# Patient Record
Sex: Female | Born: 1985 | Race: White | Hispanic: No | Marital: Single | Smoking: Former smoker
Health system: Southern US, Community
[De-identification: ages and names within clinical notes are randomized; demographics above are authoritative.]

## PROBLEM LIST (undated history)

## (undated) DIAGNOSIS — E669 Obesity, unspecified: Secondary | ICD-10-CM

## (undated) DIAGNOSIS — N979 Female infertility, unspecified: Secondary | ICD-10-CM

## (undated) DIAGNOSIS — K219 Gastro-esophageal reflux disease without esophagitis: Secondary | ICD-10-CM

## (undated) HISTORY — DX: Gastro-esophageal reflux disease without esophagitis: K21.9

## (undated) HISTORY — DX: Morbid (severe) obesity due to excess calories: E66.01

## (undated) HISTORY — DX: Female infertility, unspecified: N97.9

## (undated) HISTORY — DX: Obesity, unspecified: E66.9

---
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
## 2003-04-16 ENCOUNTER — Other Ambulatory Visit: Admission: RE | Admit: 2003-04-16 | Discharge: 2003-04-16 | Payer: Self-pay | Admitting: Obstetrics and Gynecology

## 2004-05-26 ENCOUNTER — Other Ambulatory Visit: Admission: RE | Admit: 2004-05-26 | Discharge: 2004-05-26 | Payer: Self-pay | Admitting: Obstetrics and Gynecology

## 2006-03-08 ENCOUNTER — Encounter: Admission: RE | Admit: 2006-03-08 | Discharge: 2006-03-08 | Payer: Self-pay | Admitting: Gastroenterology

## 2008-12-22 ENCOUNTER — Emergency Department (HOSPITAL_COMMUNITY): Admission: EM | Admit: 2008-12-22 | Discharge: 2008-12-22 | Payer: Self-pay | Admitting: Family Medicine

## 2009-04-11 ENCOUNTER — Emergency Department (HOSPITAL_COMMUNITY): Admission: EM | Admit: 2009-04-11 | Discharge: 2009-04-11 | Payer: Self-pay | Admitting: Family Medicine

## 2009-09-01 ENCOUNTER — Emergency Department (HOSPITAL_COMMUNITY): Admission: EM | Admit: 2009-09-01 | Discharge: 2009-09-01 | Payer: Self-pay | Admitting: Family Medicine

## 2009-09-07 ENCOUNTER — Emergency Department (HOSPITAL_COMMUNITY): Admission: EM | Admit: 2009-09-07 | Discharge: 2009-09-07 | Payer: Self-pay | Admitting: Emergency Medicine

## 2010-01-01 ENCOUNTER — Emergency Department (HOSPITAL_BASED_OUTPATIENT_CLINIC_OR_DEPARTMENT_OTHER): Admission: EM | Admit: 2010-01-01 | Discharge: 2010-01-01 | Payer: Self-pay | Admitting: Emergency Medicine

## 2010-01-01 ENCOUNTER — Ambulatory Visit: Payer: Self-pay | Admitting: Interventional Radiology

## 2010-06-27 LAB — GC/CHLAMYDIA PROBE AMP, GENITAL
Chlamydia, DNA Probe: NEGATIVE
GC Probe Amp, Genital: NEGATIVE

## 2010-06-27 LAB — POCT URINALYSIS DIP (DEVICE)
Nitrite: POSITIVE — AB
Protein, ur: 30 mg/dL — AB
Specific Gravity, Urine: >=1.03 (ref 1.005–1.030)
Urobilinogen, UA: 1 mg/dL (ref 0.0–1.0)
Urobilinogen, UA: 1 mg/dL (ref 0.0–1.0)
pH: 5.5 (ref 5.0–8.0)

## 2010-06-27 LAB — HERPES SIMPLEX VIRUS CULTURE

## 2010-06-27 LAB — WET PREP, GENITAL: Yeast Wet Prep HPF POC: NONE SEEN

## 2010-06-27 LAB — URINE CULTURE

## 2010-08-09 ENCOUNTER — Other Ambulatory Visit (HOSPITAL_COMMUNITY): Payer: Self-pay | Admitting: Obstetrics and Gynecology

## 2010-08-09 DIAGNOSIS — N971 Female infertility of tubal origin: Secondary | ICD-10-CM

## 2010-08-12 ENCOUNTER — Ambulatory Visit (HOSPITAL_COMMUNITY)
Admission: RE | Admit: 2010-08-12 | Discharge: 2010-08-12 | Disposition: A | Discharge status: Home / Self Care | Payer: BC Managed Care – PPO | Source: Ambulatory Visit | Attending: Obstetrics and Gynecology | Admitting: Obstetrics and Gynecology

## 2010-08-12 DIAGNOSIS — N971 Female infertility of tubal origin: Secondary | ICD-10-CM

## 2010-08-12 DIAGNOSIS — N979 Female infertility, unspecified: Secondary | ICD-10-CM | POA: Insufficient documentation

## 2014-08-09 ENCOUNTER — Encounter (HOSPITAL_COMMUNITY): Payer: Self-pay | Admitting: *Deleted

## 2014-08-09 ENCOUNTER — Emergency Department (HOSPITAL_COMMUNITY)
Admission: EM | Admit: 2014-08-09 | Discharge: 2014-08-09 | Disposition: A | Discharge status: Home / Self Care | Payer: BC Managed Care – PPO | Attending: Emergency Medicine | Admitting: Emergency Medicine

## 2014-08-09 ENCOUNTER — Emergency Department (HOSPITAL_COMMUNITY): Payer: BC Managed Care – PPO

## 2014-08-09 DIAGNOSIS — R112 Nausea with vomiting, unspecified: Secondary | ICD-10-CM | POA: Diagnosis not present

## 2014-08-09 DIAGNOSIS — Z87891 Personal history of nicotine dependence: Secondary | ICD-10-CM | POA: Diagnosis not present

## 2014-08-09 DIAGNOSIS — Z3202 Encounter for pregnancy test, result negative: Secondary | ICD-10-CM | POA: Diagnosis not present

## 2014-08-09 DIAGNOSIS — R05 Cough: Secondary | ICD-10-CM | POA: Diagnosis not present

## 2014-08-09 DIAGNOSIS — R51 Headache: Secondary | ICD-10-CM | POA: Insufficient documentation

## 2014-08-09 DIAGNOSIS — R519 Headache, unspecified: Secondary | ICD-10-CM

## 2014-08-09 LAB — POC URINE PREG, ED: Preg Test, Ur: NEGATIVE

## 2014-08-09 MED ORDER — DIPHENHYDRAMINE HCL 50 MG/ML IJ SOLN
25.0000 mg | Freq: Once | INTRAMUSCULAR | Status: AC
  Administered 2014-08-09: 25 mg via INTRAVENOUS
  Filled 2014-08-09: qty 1

## 2014-08-09 MED ORDER — KETOROLAC TROMETHAMINE 30 MG/ML IJ SOLN
30.0000 mg | Freq: Once | INTRAMUSCULAR | Status: AC
  Administered 2014-08-09: 30 mg via INTRAVENOUS
  Filled 2014-08-09: qty 1

## 2014-08-09 MED ORDER — SODIUM CHLORIDE 0.9 % IV BOLUS (SEPSIS)
500.0000 mL | Freq: Once | INTRAVENOUS | Status: AC
  Administered 2014-08-09: 500 mL via INTRAVENOUS

## 2014-08-09 MED ORDER — FENTANYL CITRATE (PF) 100 MCG/2ML IJ SOLN
50.0000 ug | Freq: Once | INTRAMUSCULAR | Status: AC
  Administered 2014-08-09: 50 ug via INTRAVENOUS
  Filled 2014-08-09: qty 2

## 2014-08-09 MED ORDER — PROMETHAZINE HCL 25 MG/ML IJ SOLN
25.0000 mg | Freq: Once | INTRAMUSCULAR | Status: AC
  Administered 2014-08-09: 25 mg via INTRAVENOUS
  Filled 2014-08-09: qty 1

## 2014-08-09 NOTE — Discharge Instructions (Signed)

## 2014-08-09 NOTE — ED Provider Notes (Signed)
CSN: [id number]     Arrival date & time 08/09/14  1045 History   First MD Initiated Contact with Patient 08/09/14 1048     Chief Complaint  Patient presents with  . Migraine     (Consider location/radiation/quality/duration/timing/severity/associated sxs/prior Treatment) Patient is a 29 y.o. female presenting with migraines. The history is provided by the patient.  Migraine Associated symptoms include headaches. Pertinent negatives include no chest pain, no abdominal pain and no shortness of breath.   patient has had a headache since yesterday. It is throbbing and on the back of her head. His been getting more severe. No fevers or chills. She does have some photophobia and nausea. States she had headaches when she was younger that were migraines and this is a lot like that but it never lasted this long that she never had vomiting with it. No new trauma. She has had a little bit of a cough. The light bothers her a little bit. She does not think she can be pregnant because she has tried IVF and not gotten pregnant. No diarrhea. Sick contacts.  History reviewed. No pertinent past medical history. History reviewed. No pertinent past surgical history. History reviewed. No pertinent family history. History  Substance Use Topics  . Smoking status: Former Games developermoker  . Smokeless tobacco: Not on file  . Alcohol Use: Yes     Comment: ocassional   OB History    No data available     Review of Systems  Constitutional: Negative for activity change and appetite change.  Eyes: Positive for photophobia. Negative for pain.  Respiratory: Positive for cough. Negative for chest tightness and shortness of breath.   Cardiovascular: Negative for chest pain and leg swelling.  Gastrointestinal: Positive for nausea and vomiting. Negative for abdominal pain and diarrhea.  Genitourinary: Negative for flank pain.  Musculoskeletal: Negative for back pain, neck pain and neck stiffness.  Skin: Negative for rash.   Neurological: Positive for headaches. Negative for weakness and numbness.  Psychiatric/Behavioral: Negative for behavioral problems.      Allergies  Review of patient's allergies indicates not on file.  Home Medications   Prior to Admission medications   Not on File   BP 116/69 mmHg  Pulse 95  Temp(Src) 99.6 F (37.6 C) (Rectal)  Resp 14  Ht 5\' 8"  (1.727 m)  Wt 300 lb (136.079 kg)  BMI 45.63 kg/m2  SpO2 97%  LMP 06/28/2014 Physical Exam  Constitutional: She appears well-developed.  Patient is obese  HENT:  Head: Atraumatic.  Neck: Neck supple.  Cardiovascular: Regular rhythm.   Tachycardia  Pulmonary/Chest: Effort normal.  Abdominal: Soft.  Musculoskeletal: Normal range of motion.  Neurological: She is alert.  Skin: Skin is warm.  Nursing note and vitals reviewed.   ED Course  Procedures (including critical care time) Labs Review Labs Reviewed  POC URINE PREG, ED    Imaging Review Ct Head Wo Contrast  08/09/2014   CLINICAL DATA:  Small headache yesterday. Now worse headache she has ever had. Vomiting.  EXAM: CT HEAD WITHOUT CONTRAST  TECHNIQUE: Contiguous axial images were obtained from the base of the skull through the vertex without intravenous contrast.  COMPARISON:  None.  FINDINGS: There is no intra or extra-axial fluid collection or mass lesion. The basilar cisterns and ventricles have a normal appearance. There is no CT evidence for acute infarction or hemorrhage. Bone windows are unremarkable.  IMPRESSION: Negative exam.   Electronically Signed   By: Norva PavlovElizabeth  Brown M.D.   On:  08/09/2014 12:18     EKG Interpretation None      MDM   Final diagnoses:  Acute nonintractable headache, unspecified headache type    Patient with headache. Previous history of migraines but not recently. No fever. Feels better after treatment and will be discharged home. Will give information for neurology follow-up.    Benjiman Core, MD 08/09/14 1351

## 2014-08-09 NOTE — ED Notes (Signed)
Pt is in stable condition upon d/c and ambulates from ED. 

## 2014-08-09 NOTE — ED Notes (Signed)
Pt arrives from home via POV. Pt states she woke up yesterday with a small h/a. Pt states she took ibuprofen, percocet and tried a netty pot with no relief. Pt states her h/a has continued to intensify and has also experienced some blurred vision, light sensitivity and has had 2 episodes of vomiting.

## 2014-08-11 ENCOUNTER — Encounter: Payer: Self-pay | Admitting: Neurology

## 2014-08-11 ENCOUNTER — Ambulatory Visit (INDEPENDENT_AMBULATORY_CARE_PROVIDER_SITE_OTHER): Payer: BC Managed Care – PPO | Admitting: Neurology

## 2014-08-11 VITALS — BP 150/84 | HR 86 | Resp 20 | Ht 68.9 in | Wt 307.0 lb

## 2014-08-11 DIAGNOSIS — G43511 Persistent migraine aura without cerebral infarction, intractable, with status migrainosus: Secondary | ICD-10-CM

## 2014-08-11 HISTORY — DX: Morbid (severe) obesity due to excess calories: E66.01

## 2014-08-11 MED ORDER — TOPIRAMATE 25 MG PO TABS: 25.0000 mg | ORAL_TABLET | Freq: Two times a day (BID) | ORAL | Status: DC

## 2014-08-11 MED ORDER — SUMATRIPTAN 5 MG/ACT NA SOLN: 1.0000 | NASAL | Status: DC | PRN

## 2014-08-11 MED ORDER — PROMETHAZINE HCL 12.5 MG PO TABS: 12.5000 mg | ORAL_TABLET | Freq: Four times a day (QID) | ORAL | Status: DC | PRN

## 2014-08-11 NOTE — Patient Instructions (Signed)
Topiramate tablets What is this medicine? TOPIRAMATE (toe PYRE a mate) is used to treat seizures in adults or children with epilepsy. It is also used for the prevention of migraine headaches. This medicine may be used for other purposes; ask your health care provider or pharmacist if you have questions. COMMON BRAND NAME(S): Topamax, Topiragen What should I tell my health care provider before I take this medicine? They need to know if you have any of these conditions: -bleeding disorders -cirrhosis of the liver or liver disease -diarrhea -glaucoma -kidney stones or kidney disease -low blood counts, like low white cell, platelet, or red cell counts -lung disease like asthma, obstructive pulmonary disease, emphysema -metabolic acidosis -on a ketogenic diet -schedule for surgery or a procedure -suicidal thoughts, plans, or attempt; a previous suicide attempt by you or a family member -an unusual or allergic reaction to topiramate, other medicines, foods, dyes, or preservatives -pregnant or trying to get pregnant -breast-feeding How should I use this medicine? Take this medicine by mouth with a glass of water. Follow the directions on the prescription label. Do not crush or chew. You may take this medicine with meals. Take your medicine at regular intervals. Do not take it more often than directed. Talk to your pediatrician regarding the use of this medicine in children. Special care may be needed. While this drug may be prescribed for children as young as 86 years of age for selected conditions, precautions do apply. Overdosage: If you think you have taken too much of this medicine contact a poison control center or emergency room at once. NOTE: This medicine is only for you. Do not share this medicine with others. What if I miss a dose? If you miss a dose, take it as soon as you can. If your next dose is to be taken in less than 6 hours, then do not take the missed dose. Take the next dose at  your regular time. Do not take double or extra doses. What may interact with this medicine? Do not take this medicine with any of the following medications: -probenecid This medicine may also interact with the following medications: -acetazolamide -alcohol -amitriptyline -aspirin and aspirin-like medicines -birth control pills -certain medicines for depression -certain medicines for seizures -certain medicines that treat or prevent blood clots like warfarin, enoxaparin, dalteparin, apixaban, dabigatran, and rivaroxaban -digoxin -hydrochlorothiazide -lithium -medicines for pain, sleep, or muscle relaxation -metformin -methazolamide -NSAIDS, medicines for pain and inflammation, like ibuprofen or naproxen -pioglitazone -risperidone This list may not describe all possible interactions. Give your health care provider a list of all the medicines, herbs, non-prescription drugs, or dietary supplements you use. Also tell them if you smoke, drink alcohol, or use illegal drugs. Some items may interact with your medicine. What should I watch for while using this medicine? Visit your doctor or health care professional for regular checks on your progress. Do not stop taking this medicine suddenly. This increases the risk of seizures if you are using this medicine to control epilepsy. Wear a medical identification bracelet or chain to say you have epilepsy or seizures, and carry a card that lists all your medicines. This medicine can decrease sweating and increase your body temperature. Watch for signs of deceased sweating or fever, especially in children. Avoid extreme heat, hot baths, and saunas. Be careful about exercising, especially in hot weather. Contact your health care provider right away if you notice a fever or decrease in sweating. You should drink plenty of fluids while taking this medicine.  If you have had kidney stones in the past, this will help to reduce your chances of forming kidney  stones. If you have stomach pain, with nausea or vomiting and yellowing of your eyes or skin, call your doctor immediately. You may get drowsy, dizzy, or have blurred vision. Do not drive, use machinery, or do anything that needs mental alertness until you know how this medicine affects you. To reduce dizziness, do not sit or stand up quickly, especially if you are an older patient. Alcohol can increase drowsiness and dizziness. Avoid alcoholic drinks. If you notice blurred vision, eye pain, or other eye problems, seek medical attention at once for an eye exam. The use of this medicine may increase the chance of suicidal thoughts or actions. Pay special attention to how you are responding while on this medicine. Any worsening of mood, or thoughts of suicide or dying should be reported to your health care professional right away. This medicine may increase the chance of developing metabolic acidosis. If left untreated, this can cause kidney stones, bone disease, or slowed growth in children. Symptoms include breathing fast, fatigue, loss of appetite, irregular heartbeat, or loss of consciousness. Call your doctor immediately if you experience any of these side effects. Also, tell your doctor about any surgery you plan on having while taking this medicine since this may increase your risk for metabolic acidosis. Birth control pills may not work properly while you are taking this medicine. Talk to your doctor about using an extra method of birth control. Women who become pregnant while using this medicine may enroll in the [place] [employer] Antiepileptic Drug Pregnancy Registry by calling [phone]. This registry collects information about the safety of antiepileptic drug use during pregnancy. What side effects may I notice from receiving this medicine? Side effects that you should report to your doctor or health care professional as soon as possible: -allergic reactions like skin rash, itching or hives,  swelling of the face, lips, or tongue -decreased sweating and/or rise in body temperature -depression -difficulty breathing, fast or irregular breathing patterns -difficulty speaking -difficulty walking or controlling muscle movements -hearing impairment -redness, blistering, peeling or loosening of the skin, including inside the mouth -tingling, pain or numbness in the hands or feet -unusual bleeding or bruising -unusually weak or tired -worsening of mood, thoughts or actions of suicide or dying Side effects that usually do not require medical attention (report to your doctor or health care professional if they continue or are bothersome): -altered taste -back pain, joint or muscle aches and pains -diarrhea, or constipation -headache -loss of appetite -nausea -stomach upset, indigestion -tremors This list may not describe all possible side effects. Call your doctor for medical advice about side effects. You may report side effects to FDA at 1-800-FDA-1088. Where should I keep my medicine? Keep out of the reach of children. Store at room temperature between 15 and 30 degrees C (59 and 86 degrees F) in a tightly closed container. Protect from moisture. Throw away any unused medicine after the expiration date. NOTE: This sheet is a summary. It may not cover all possible information. If you have questions about this medicine, talk to your doctor, pharmacist, or health care provider.  2015, [employer]. (2013-03-31 23:17:57) Promethazine tablets What is this medicine? PROMETHAZINE (proe METH a zeen) is an antihistamine. It is used to treat allergic reactions and to treat or prevent nausea and vomiting from illness or motion sickness. It is also used to make you sleep before surgery, and  to help treat pain or nausea after surgery. This medicine may be used for other purposes; ask your health care provider or pharmacist if you have questions. COMMON BRAND NAME(S): Phenergan What  should I tell my health care provider before I take this medicine? They need to know if you have any of these conditions: -glaucoma -high blood pressure or heart disease -kidney disease -liver disease -lung or breathing disease, like asthma -prostate trouble -pain or difficulty passing urine -seizures -an unusual or allergic reaction to promethazine or phenothiazines, other medicines, foods, dyes, or preservatives -pregnant or trying to get pregnant -breast-feeding How should I use this medicine? Take this medicine by mouth with a glass of water. Follow the directions on the prescription label. Take your doses at regular intervals. Do not take your medicine more often than directed. Talk to your pediatrician regarding the use of this medicine in children. Special care may be needed. This medicine should not be given to infants and children younger than 40 years old. Overdosage: If you think you have taken too much of this medicine contact a poison control center or emergency room at once. NOTE: This medicine is only for you. Do not share this medicine with others. What if I miss a dose? If you miss a dose, take it as soon as you can. If it is almost time for your next dose, take only that dose. Do not take double or extra doses. What may interact with this medicine? Do not take this medicine with any of the following medications: -cisapride -dofetilide -dronedarone -MAOIs like Carbex, Eldepryl, Marplan, Nardil, Parnate -pimozide -quinidine, including dextromethorphan; quinidine -thioridazine -ziprasidone This medicine may also interact with the following medications: -certain medicines for depression, anxiety, or psychotic disturbances -certain medicines for anxiety or sleep -certain medicines for seizures like carbamazepine, phenobarbital, phenytoin -certain medicines for movement abnormalities as in Parkinson's disease, or for gastrointestinal problems -epinephrine -medicines for  allergies or colds -muscle relaxants -narcotic medicines for pain -other medicines that prolong the QT interval (cause an abnormal heart rhythm) -tramadol -trimethobenzamide This list may not describe all possible interactions. Give your health care provider a list of all the medicines, herbs, non-prescription drugs, or dietary supplements you use. Also tell them if you smoke, drink alcohol, or use illegal drugs. Some items may interact with your medicine. What should I watch for while using this medicine? Tell your doctor or health care professional if your symptoms do not start to get better in 1 to 2 days. You may get drowsy or dizzy. Do not drive, use machinery, or do anything that needs mental alertness until you know how this medicine affects you. To reduce the risk of dizzy or fainting spells, do not stand or sit up quickly, especially if you are an older patient. Alcohol may increase dizziness and drowsiness. Avoid alcoholic drinks. Your mouth may get dry. Chewing sugarless gum or sucking hard candy, and drinking plenty of water may help. Contact your doctor if the problem does not go away or is severe. This medicine may cause dry eyes and blurred vision. If you wear contact lenses you may feel some discomfort. Lubricating drops may help. See your eye doctor if the problem does not go away or is severe. This medicine can make you more sensitive to the sun. Keep out of the sun. If you cannot avoid being in the sun, wear protective clothing and use sunscreen. Do not use sun lamps or tanning beds/booths. If you are diabetic, check your blood-sugar levels  regularly. What side effects may I notice from receiving this medicine? Side effects that you should report to your doctor or health care professional as soon as possible: -blurred vision -irregular heartbeat, palpitations or chest pain -muscle or facial twitches -pain or difficulty passing urine -seizures -skin rash -slowed or shallow  breathing -unusual bleeding or bruising -yellowing of the eyes or skin Side effects that usually do not require medical attention (report to your doctor or health care professional if they continue or are bothersome): -headache -nightmares, agitation, nervousness, excitability, not able to sleep (these are more likely in children) -stuffy nose This list may not describe all possible side effects. Call your doctor for medical advice about side effects. You may report side effects to FDA at 1-800-FDA-1088. Where should I keep my medicine? Keep out of the reach of children. Store at room temperature, between 20 and 25 degrees C (68 and 77 degrees F). Protect from light. Throw away any unused medicine after the expiration date. NOTE: This sheet is a summary. It may not cover all possible information. If you have questions about this medicine, talk to your doctor, pharmacist, or health care provider.  2015, [employer]. (2012-11-26 15:04:46)

## 2014-08-11 NOTE — Progress Notes (Signed)
[facility]   Provider:  Melvyn Novas, M D  Referring Provider: Primary Care Physician:  Bosie Clos, MD  Chief Complaint  Patient presents with  . Migraine    new patient, rm 10, alone    HPI:  Jeree B Reicher is a 29 y.o. female  Is seen here as a referral from Emergency Room, Dr Benjiman Core  Mrs. Piedad Climes reports that her had begun hurting on Saturday morning and there headaches exacerbated further. She tried home remedies she tried to sleep it off she used ice packs she used hot showers etc. nothing seemed to have attached a headache by Sunday she was desperate enough to go to the emergency room. There a CT of the head was obtained which was REM unremarkable. There was a bunch of blood draws as well. A pregnancy test was negative. Mrs. Piedad Climes has actually tried to conceive and had in vitro fertilization but this has been unsuccessful thus far. On Saturday she had tried some ibuprofen, to no avail by 2 in the morning on Sunday morning she felt so ill she had to vomit. She vomited twice and then she called her parents to drive her to the emergency room as she did not feel capable of doing so. Another observation is that on Saturday she could not read the labels of the spice rack in her kitchen her vision was too blurred. She had also had her hair done on Saturday she had her hair straightened but this usually does not cause her to have headaches or discomfort. Localized over the occiput and to the nape of the neck she felt a throbbing headache and her neck area felt very tight and tender. Massaging the occipital area did not help her. The patient received medication in the emergency room she was given Benadryl, fentanyl injection Toradol and Phenergan she was also given a 500 mL fluid bolus of normal saline. She fell asleep and the ED physician discharged her. She slept at home still for several hours, She did nor return to work.  She woke up on Monday morning feeling  somewhat restored but today she has again retro-orbital pressure and she is fearful that she will trigger another headache attack she is also concerned that she may not be able to drive again. She also reported that she had extreme photophobia, bright lights may take her headaches worse, and today in the room I had to dim the lights a little bit.  Over the last 18 months however she has not been on fertility drugs, and is on no medications . She has always been obese. She also is not on nutritional supplements such as multivitamins etc. She has not ever had a headache of the intensity and duration that she suffered last weekend. In her review of systems she endorsed blurred vision fevers chills coughing feeling hot and cold having an increased level of thirst dizziness headaches insomnia and change in appetite. He has a past medical history for infertility and obesity. She is a former smoker was not actively smoking. Her family medical conditions are positive for father with heart disease pituitary tumor there have been a mother and maternal relatives such as grandmother and aunts with thyroid problems and pulse grandmothers paternal and maternal were diabetic.   The patient has no history of any major surgeries traumatic brain injuries or other trauma. She was not infected at the time of the headache also   Review of Systems: Out of a complete 14 system  review, the patient complains of only the following symptoms, and all other reviewed systems are negative. headache  Fever and chills.  Eye pressure, neck tightness.    History   Social History  . Marital Status: Married    Spouse Name: N/A  . Number of Children: N/A  . Years of Education: N/A   Occupational History  . teacher    Social History Main Topics  . Smoking status: Former Smoker    Quit date: 04/10/2010  . Smokeless tobacco: Not on file  . Alcohol Use: 0.0 oz/week    0 Standard drinks or equivalent per week     Comment:  occasional  . Drug Use: No  . Sexual Activity: Yes   Other Topics Concern  . Not on file   Social History Narrative   Drinks 4-5 cups daily.    Family History  Problem Relation Age of Onset  . Thyroid disease Mother     Past Medical History  Diagnosis Date  . Obesity   . Female fertility problems   . GERD (gastroesophageal reflux disease)     History reviewed. No pertinent past surgical history.  No current outpatient prescriptions on file.   No current facility-administered medications for this visit.    Allergies as of 08/11/2014  . (Not on File)    Vitals: BP 150/84 mmHg  Pulse 86  Resp 20  Ht 5' 8.9" (1.75 m)  Wt 307 lb (139.254 kg)  BMI 45.47 kg/m2  LMP 06/28/2014 Last Weight:  Wt Readings from Last 1 Encounters:  08/11/14 307 lb (139.254 kg)       Last Height:   Ht Readings from Last 1 Encounters:  08/11/14 5' 8.9" (1.75 m)    Physical exam:  General: The patient is awake, alert and appears not in acute distress. The patient is well groomed. Head: Normocephalic, atraumatic.  Neck is supple. Mallampati , 3  neck circumference: 17. Nasal airflow restricted , TMJ is not  evident . Retrognathia is seen.  Cardiovascular:  Regular rate and rhythm , without  murmurs or carotid bruit, and without distended neck veins. Respiratory: Lungs are clear to auscultation. Skin:  Without evidence of edema, or rash Trunk: BMI is severely  elevated and patient  has normal posture.  Neurologic exam : The patient is awake and alert, oriented to place and time.   Memory subjective  described as intact. There is a normal attention span & concentration ability.  Speech is fluent without dysarthria, dysphonia or aphasia. Mood and affect are appropriate.  Cranial nerves: Pupils are equal and briskly reactive to light. Funduscopic exam without  evidence of  edema. Extraocular movements  in vertical and horizontal planes intact and without nystagmus. Visual fields by  finger perimetry are intact. Hearing to finger rub intact.   The patient is photosensitive -Facial sensation intact to fine touch.  Facial motor strength is symmetric and tongue and uvula move midline.  Motor exam:   Normal tone ,muscle bulk and symmetric ,strength in all extremities.  Sensory:  Fine touch, pinprick and vibration were tested in all extremities. Proprioception is  normal.  Coordination: Rapid alternating movements in the fingers/hands is normal. Finger-to-nose maneuver  normal without evidence of ataxia, dysmetria or tremor.  Gait and station: Patient walks without assistive device and is able unassisted to climb up to the exam table.  Strength within normal limits. Stance is stable and normal. Tandem gait is unfragmented. Romberg testing is negative.  Deep tendon reflexes: in the  upper and lower extremities are symmetric and intact.  Babinski maneuver response is  downgoing.   Assessment:  After physical and neurologic examination, review of laboratory studies, imaging, neurophysiology testing and pre-existing records, assessment is   Patient describes a pulsating headache associated with photophobia severe nausea and vomiting. This would qualify as migraines. Given her risk factors aren't elevated BMI she could also have an elevated intracranial pressure. Her CT did not show a mass.  He was younger a teenager actually she had sometimes headaches that would wake her from sleep but she has no sleep related headaches at this time headaches normally have started somewhere during the day and got progressively worse sleep or resting was actually a way to escape the headaches. Since the migraine cocktail in the emergency room has worked our also provide her with some oral medication that she can take at home but one of my goals for the patient is to start her on topiramate 25 mg twice a day this also may induce more urinary it usually addresses in obese patients the intercranial  pressure and reduce his. At this time I do not order a lumbar puncture as she feels she has gotten better and I will concentrate on this headache as a migraine. However should she have from here on out more of these headaches and more headaches that linger sometimes for a day or 2 I would certainly be contemplating an MRI of the brain and a lumbar puncture in a revisit.  Besides the topiramate to reduce headache trigger I will ask the patient to keep hydrating herself with water, avoiding the artificial sweeteners and concentrate on Protein and fresh greens and fruits. The patient was exquisitely nauseated with her headaches a triptan orally is not likely to help her. I will order her some Phenergan she has Benadryl at home, I would like to consider a nasal spray triptan as it may work quicker and bypasses the oral intake in a nauseated patient.   The patient was advised of the nature of the diagnosed sleep disorder , the treatment options and risks for general a health and wellness arising from not treating the condition. Visit duration was 30 minutes.   Plan:  Treatment plan and additional workup :  Start topiramate 25 mg twice a day by mouth #1  #2 Imitrex as nasal spray or any other triptan as nasal spray for that matter.  #3 Phenergan for home use can be suppository.  #4 hydration hydration hydration  #5 MRI and spinal tap postponed the that these tests will be done only if the patient doesn't have relief over the next 14 days.   Porfirio Mylar  Deadra Diggins M.D.     Porfirio Mylar Japleen Tornow MD  08/11/2014

## 2014-08-26 ENCOUNTER — Ambulatory Visit: Payer: BC Managed Care – PPO | Admitting: Neurology

## 2014-09-21 ENCOUNTER — Ambulatory Visit: Payer: BC Managed Care – PPO | Admitting: Neurology

## 2017-01-12 ENCOUNTER — Other Ambulatory Visit: Payer: Self-pay | Admitting: Family

## 2017-01-12 ENCOUNTER — Ambulatory Visit
Admission: RE | Admit: 2017-01-12 | Discharge: 2017-01-12 | Disposition: A | Discharge status: Home / Self Care | Payer: BC Managed Care – PPO | Source: Ambulatory Visit | Attending: Family | Admitting: Family

## 2017-01-12 DIAGNOSIS — G43019 Migraine without aura, intractable, without status migrainosus: Secondary | ICD-10-CM

## 2017-11-10 IMAGING — CT CT HEAD W/O CM
1 series · 16 of 30 positions shown, 20 images · non-contrast
Comparison: 08/09/2014

CLINICAL DATA: Headaches for 5 days.

EXAM:
CT HEAD WITHOUT CONTRAST
TECHNIQUE: Contiguous axial images were obtained from the base of the skull
through the vertex without intravenous contrast.

[Series 2: head w/(date) · axial · 0.44mm/px · z∈[+359,+499]mm · 16 of 32 slices shown, 20 images]
[im 2/32  brain]
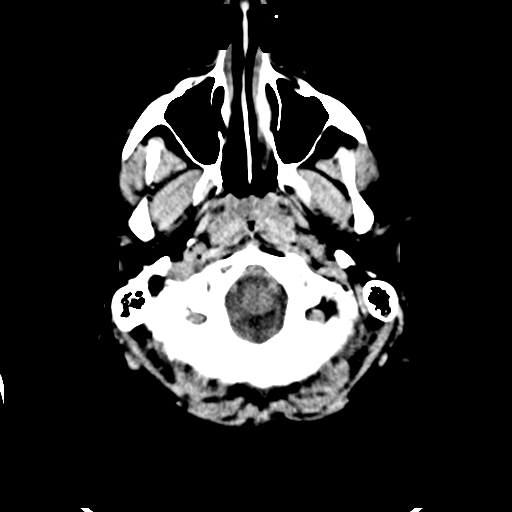
[im 2/32  bone]
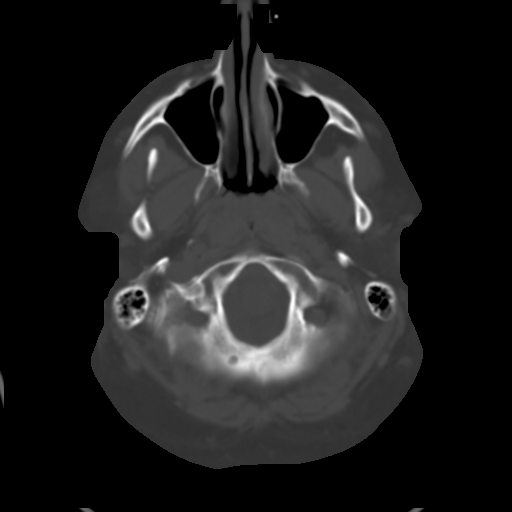
[im 4/32  brain]
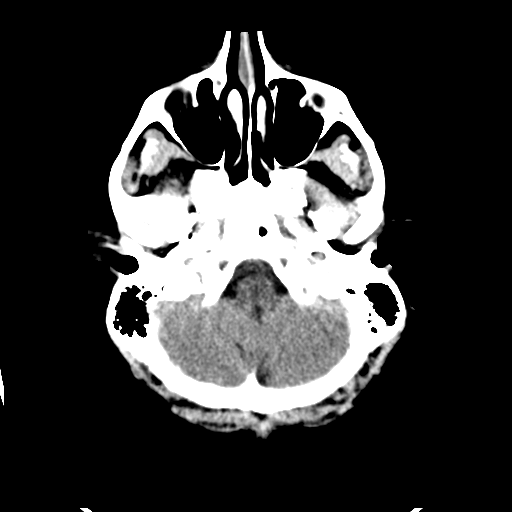
[im 6/32  brain]
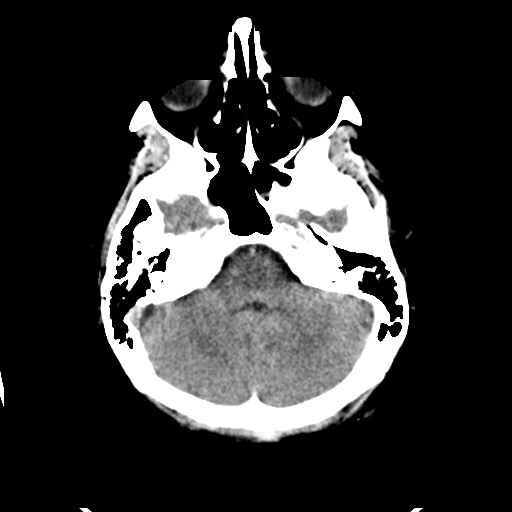
[im 8/32  brain]
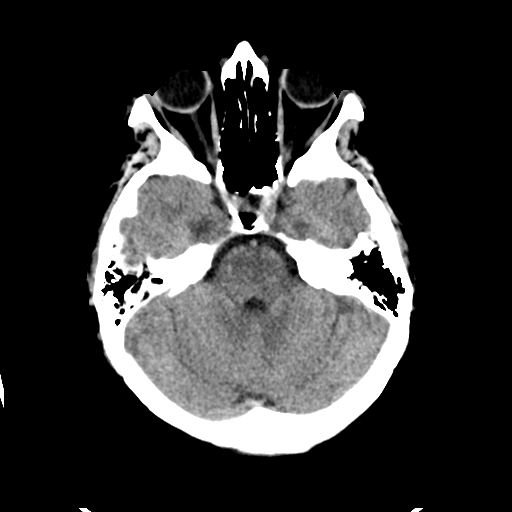
[im 9/32  brain]
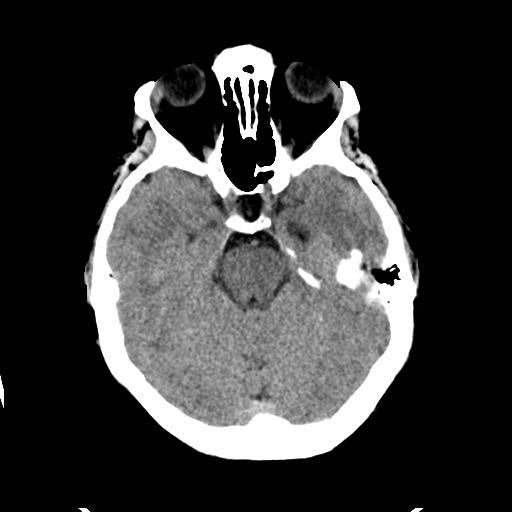
[im 9/32  bone]
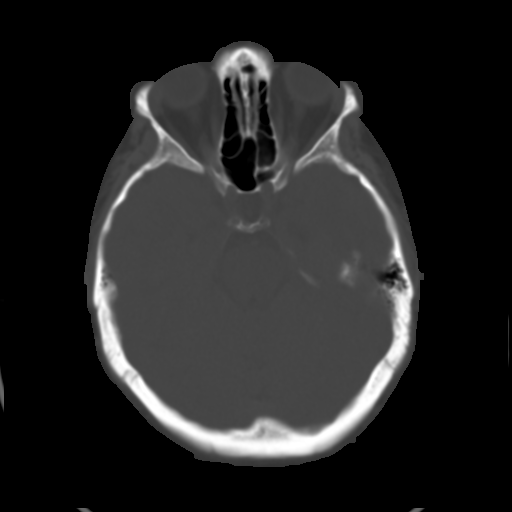
[im 11/32  brain]
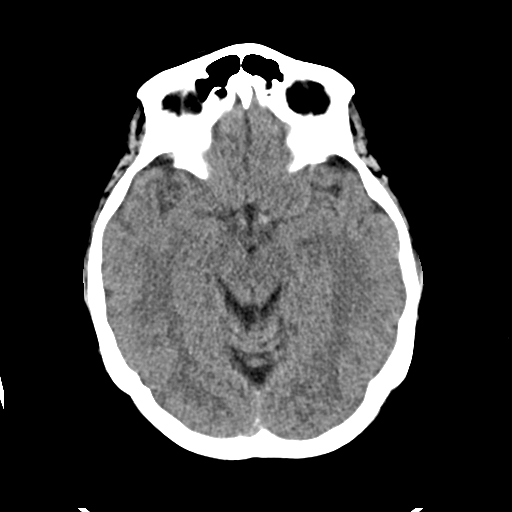
[im 13/32  brain]
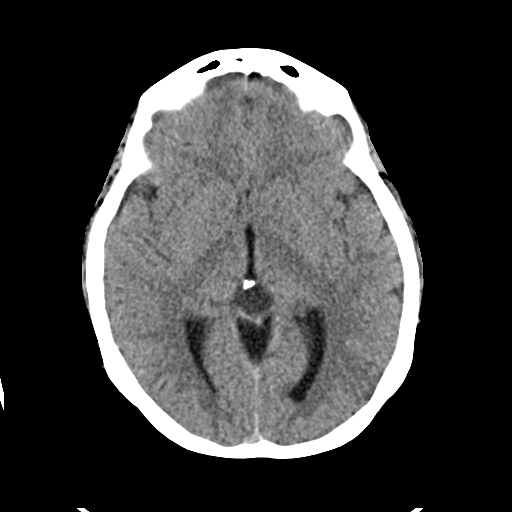
[im 15/32  brain]
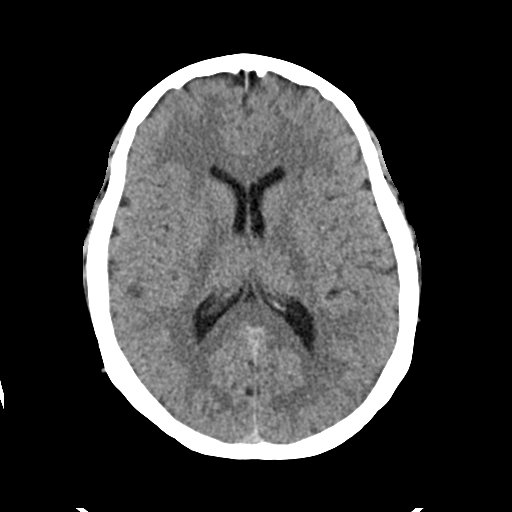
[im 17/32  brain]
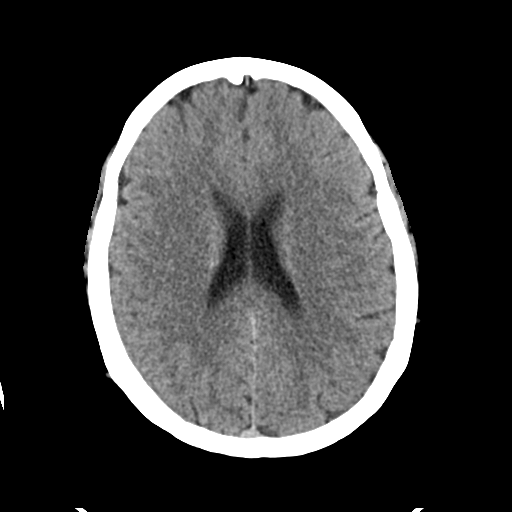
[im 17/32  bone]
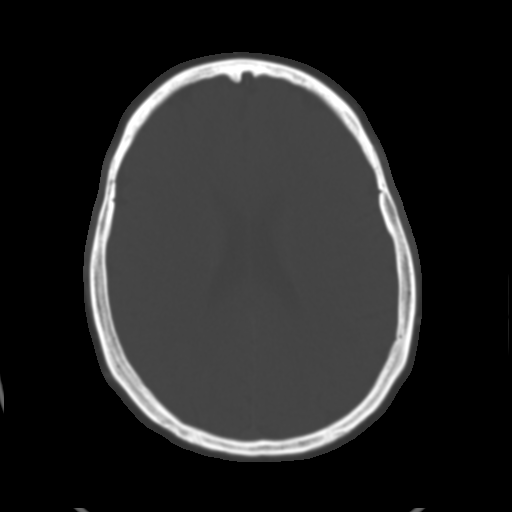
[im 19/32  brain]
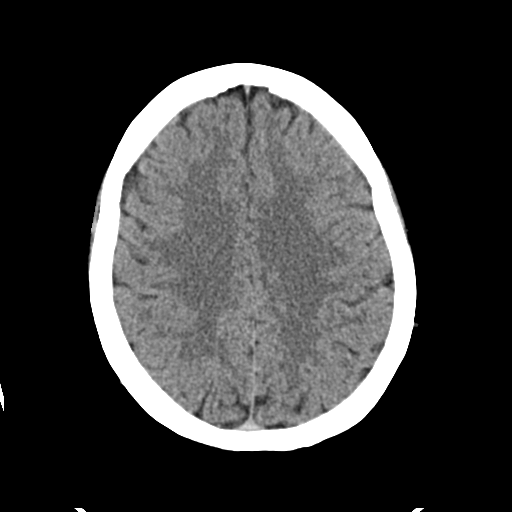
[im 21/32  brain]
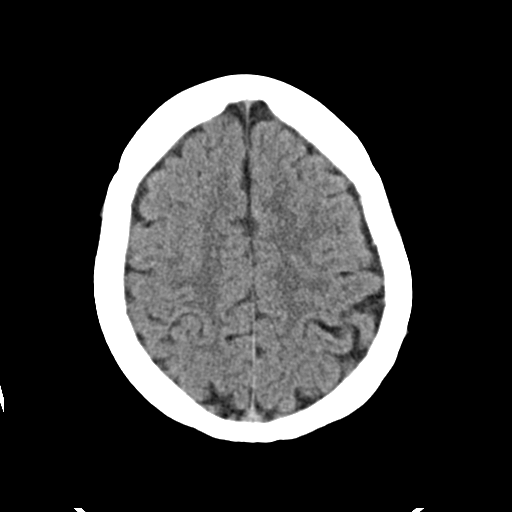
[im 23/32  brain]
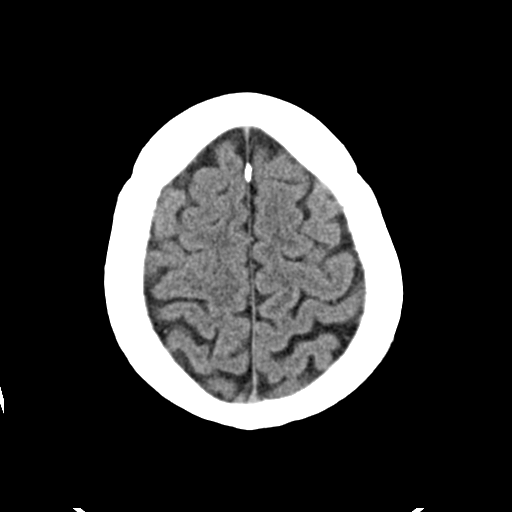
[im 24/32  brain]
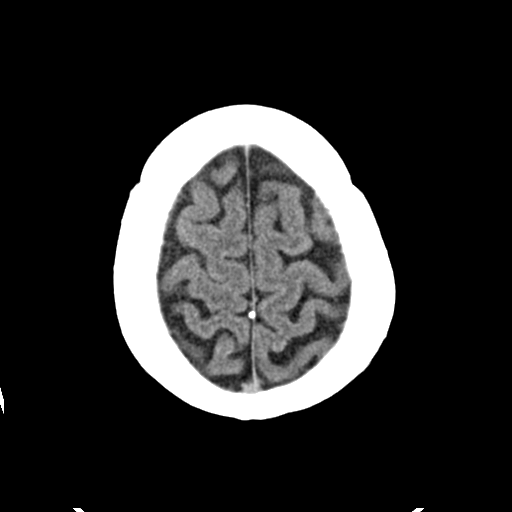
[im 24/32  bone]
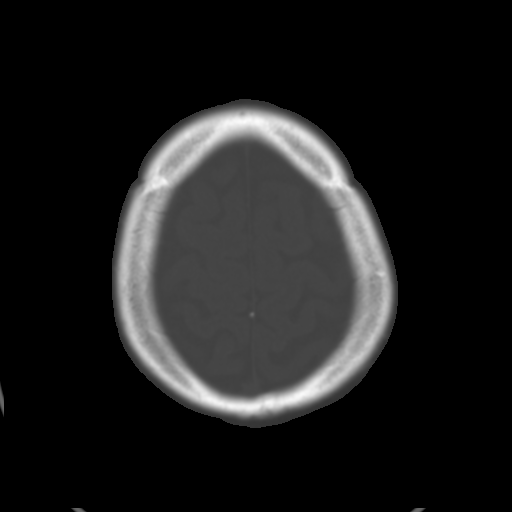
[im 26/32  brain]
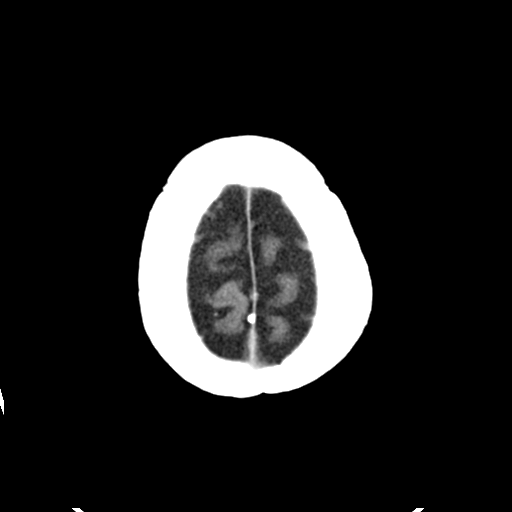
[im 28/32  brain]
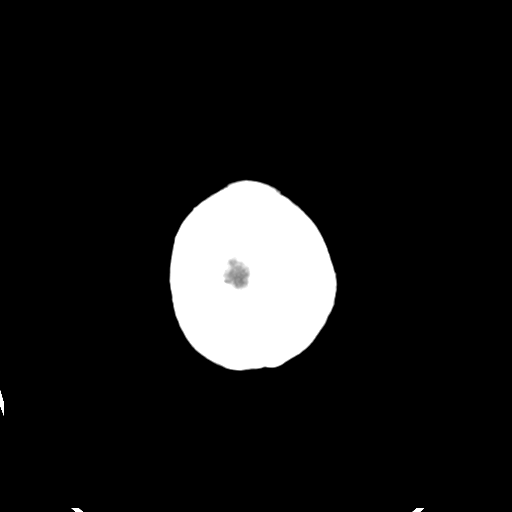
[im 30/32  brain]
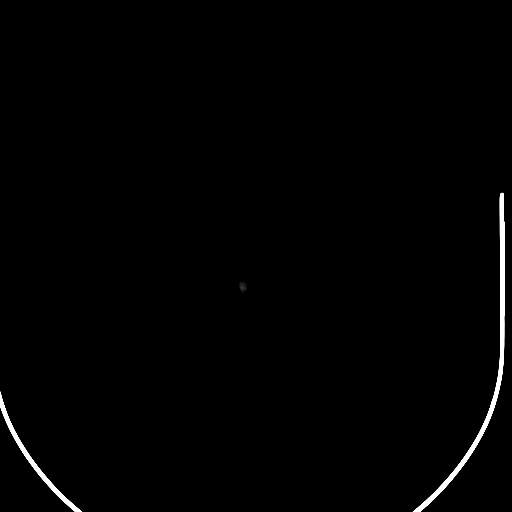

[16 of 30 positions shown; findings below may reference images not displayed]

FINDINGS: Brain: No evidence of acute infarction, hemorrhage, hydrocephalus,
extra-axial collection or mass lesion/mass effect.

Vascular: No hyperdense vessel or unexpected calcification.

Skull: Normal. Negative for fracture or focal lesion.

Sinuses/Orbits: Normal globes and orbits. Small amount of dependent
secretions in the left sphenoid sinus. Visualized sinuses otherwise
clear. Clear mastoid air cells.

Other: None.
IMPRESSION: 1. No intracranial abnormality.

## 2019-04-11 NOTE — L&D Delivery Note (Signed)
Delivery Note Once room fully set up and team prepared for delivery, arom performed and NICU called for delivery.  Cx c/c/-2; iupc placed as not palpable nor picked up with external monitoring Ctx q 2 min; pt then able to feel and then able to begin pushing; full NICU team present with pushing onset. At  a viable female was delivered over intact perineum via svd (Presentation: cephalic ; OA ).  APGAR: call to NICU, not yet provided by neonatologist , ; weight 3'5.6" (1520g)  .   Placenta status: delivered spontaneously, intact.  Cord: 3vv, with the following complications: none.  Anesthesia:  none Episiotomy:  none Lacerations:  none Suture Repair: n/a Est. Blood Loss (mL):   Mom to postpartum.  Baby to NICU.  Vick Frees 06/13/2019, 10:47 AM

## 2019-06-11 ENCOUNTER — Encounter (HOSPITAL_COMMUNITY): Payer: Self-pay | Admitting: Obstetrics and Gynecology

## 2019-06-11 ENCOUNTER — Other Ambulatory Visit: Payer: Self-pay

## 2019-06-11 ENCOUNTER — Inpatient Hospital Stay (HOSPITAL_COMMUNITY)
Admission: AD | Admit: 2019-06-11 | Discharge: 2019-06-15 | DRG: 807 | Disposition: A | Discharge status: Home / Self Care | Payer: BC Managed Care – PPO | Source: Ambulatory Visit | Attending: Obstetrics and Gynecology | Admitting: Obstetrics and Gynecology

## 2019-06-11 DIAGNOSIS — O99214 Obesity complicating childbirth: Secondary | ICD-10-CM | POA: Diagnosis present

## 2019-06-11 DIAGNOSIS — Z3A26 26 weeks gestation of pregnancy: Secondary | ICD-10-CM | POA: Diagnosis not present

## 2019-06-11 DIAGNOSIS — Z20822 Contact with and (suspected) exposure to covid-19: Secondary | ICD-10-CM | POA: Diagnosis present

## 2019-06-11 DIAGNOSIS — O99284 Endocrine, nutritional and metabolic diseases complicating childbirth: Secondary | ICD-10-CM | POA: Diagnosis present

## 2019-06-11 DIAGNOSIS — E282 Polycystic ovarian syndrome: Secondary | ICD-10-CM | POA: Diagnosis present

## 2019-06-11 LAB — COMPREHENSIVE METABOLIC PANEL
ALT: 24 U/L (ref 0–44)
AST: 19 U/L (ref 15–41)
Albumin: 2.6 g/dL — ABNORMAL LOW (ref 3.5–5.0)
Alkaline Phosphatase: 49 U/L (ref 38–126)
Anion gap: 9 (ref 5–15)
BUN: <5 mg/dL — ABNORMAL LOW (ref 6–20)
CO2: 21 mmol/L — ABNORMAL LOW (ref 22–32)
Calcium: 8.8 mg/dL — ABNORMAL LOW (ref 8.9–10.3)
Chloride: 108 mmol/L (ref 98–111)
Creatinine, Ser: 0.51 mg/dL (ref 0.44–1.00)
GFR calc Af Amer: >60 mL/min (ref >60)
GFR calc non Af Amer: >60 mL/min (ref >60)
Glucose, Bld: 104 mg/dL — ABNORMAL HIGH (ref 70–99)
Potassium: 3.6 mmol/L (ref 3.5–5.1)
Sodium: 138 mmol/L (ref 135–145)
Total Bilirubin: 0.6 mg/dL (ref 0.3–1.2)
Total Protein: 6.4 g/dL — ABNORMAL LOW (ref 6.5–8.1)

## 2019-06-11 LAB — SARS CORONAVIRUS 2 (TAT 6-24 HRS): SARS Coronavirus 2: NEGATIVE

## 2019-06-11 LAB — CBC
HCT: 36.1 % (ref 36.0–46.0)
Hemoglobin: 11.6 g/dL — ABNORMAL LOW (ref 12.0–15.0)
MCH: 28.1 pg (ref 26.0–34.0)
MCHC: 32.1 g/dL (ref 30.0–36.0)
MCV: 87.4 fL (ref 80.0–100.0)
Platelets: 336 10*3/uL (ref 150–400)
RBC: 4.13 MIL/uL (ref 3.87–5.11)
RDW: 14.7 % (ref 11.5–15.5)
WBC: 13.5 10*3/uL — ABNORMAL HIGH (ref 4.0–10.5)
nRBC: 0 % (ref 0.0–0.2)

## 2019-06-11 LAB — TYPE AND SCREEN
ABO/RH(D): A POS
Antibody Screen: NEGATIVE

## 2019-06-11 LAB — ABO/RH: ABO/RH(D): A POS

## 2019-06-11 MED ORDER — MAGNESIUM SULFATE 40 GM/1000ML IV SOLN
1.0000 g/h | INTRAVENOUS | Status: AC
  Filled 2019-06-11: qty 1000

## 2019-06-11 MED ORDER — PRENATAL MULTIVITAMIN CH
1.0000 | ORAL_TABLET | Freq: Every day | ORAL | Status: DC
  Administered 2019-06-11 – 2019-06-12 (×2): 1 via ORAL
  Filled 2019-06-11 (×2): qty 1

## 2019-06-11 MED ORDER — HYDROXYPROGESTERONE CAPROATE 250 MG/ML IM OIL
250.0000 mg | TOPICAL_OIL | Freq: Once | INTRAMUSCULAR | Status: AC
  Administered 2019-06-11: 250 mg via INTRAMUSCULAR
  Filled 2019-06-11: qty 1

## 2019-06-11 MED ORDER — HYDROXYPROGESTERONE CAPROATE 250 MG/ML IM OIL
250.0000 mg | TOPICAL_OIL | Freq: Once | INTRAMUSCULAR | Status: DC
  Filled 2019-06-11: qty 1

## 2019-06-11 MED ORDER — MAGNESIUM SULFATE BOLUS VIA INFUSION
4.0000 g | Freq: Once | INTRAVENOUS | Status: AC
  Administered 2019-06-11: 4 g via INTRAVENOUS
  Filled 2019-06-11: qty 1000

## 2019-06-11 MED ORDER — INDOMETHACIN 25 MG PO CAPS
25.0000 mg | ORAL_CAPSULE | Freq: Four times a day (QID) | ORAL | Status: DC
  Administered 2019-06-11 – 2019-06-13 (×6): 25 mg via ORAL
  Filled 2019-06-11 (×8): qty 1

## 2019-06-11 MED ORDER — ASPIRIN 81 MG PO CHEW
81.0000 mg | CHEWABLE_TABLET | Freq: Every day | ORAL | Status: DC
  Administered 2019-06-11 – 2019-06-12 (×2): 81 mg via ORAL
  Filled 2019-06-11 (×3): qty 1

## 2019-06-11 MED ORDER — LACTATED RINGERS IV SOLN: INTRAVENOUS | Status: DC

## 2019-06-11 MED ORDER — BETAMETHASONE SOD PHOS & ACET 6 (3-3) MG/ML IJ SUSP
12.0000 mg | INTRAMUSCULAR | Status: AC
  Administered 2019-06-11 – 2019-06-12 (×2): 12 mg via INTRAMUSCULAR
  Filled 2019-06-11: qty 5

## 2019-06-11 MED ORDER — ACETAMINOPHEN 325 MG PO TABS
650.0000 mg | ORAL_TABLET | Freq: Four times a day (QID) | ORAL | Status: DC | PRN
  Administered 2019-06-11 (×2): 650 mg via ORAL
  Filled 2019-06-11 (×2): qty 2

## 2019-06-11 MED ORDER — NIFEDIPINE 10 MG PO CAPS
10.0000 mg | ORAL_CAPSULE | Freq: Once | ORAL | Status: AC
  Administered 2019-06-11: 10 mg via ORAL
  Filled 2019-06-11: qty 1

## 2019-06-11 MED ORDER — INDOMETHACIN 25 MG PO CAPS
50.0000 mg | ORAL_CAPSULE | Freq: Once | ORAL | Status: AC
  Administered 2019-06-11: 50 mg via ORAL
  Filled 2019-06-11: qty 2

## 2019-06-11 NOTE — Progress Notes (Signed)
Admitted for advanced preterm cervical dilation Ht 5\' 9"  (1.753 m)   Wt (!) 155.1 kg   BMI 50.50 kg/m   HP dictated.

## 2019-06-11 NOTE — Consult Note (Signed)
[facility] 06/11/2019    8:19 PM  Neonatal Medicine Consultation         Jersey B Gulyas          MRN:  [id number]  I was called at the request of the patient's obstetrician (Dr. Billy Coast) to speak to this patient due to potential premature delivery at 26+ weeks.  The patient's prenatal course includes premature cervical dilation (4 cm currently), obesity, migraines, polycystic ovarian syndrome.  Unknow if she has gestational diabetes (has not been fully tested).  She is 26 2/7 weeks currently.  She is admitted to [facility], and is receiving treatment that includes Indomethacin, bedrest, magnesium sulfate, and betamethasone course.  The baby is a female.  I reviewed expectations for a baby born at 27+ weeks, including survival, length of stay, morbidities such as respiratory distress, IVH, infection, feeding intolerance, retinopathy.  I described how we provide respiratory and feeding support.  Mom plans to breast feed, which I encouraged as best for the baby (and could be supplemented by the use of donor breast milk).  I let mom know that the baby's outlook generally improves the longer she remains undelivered.  I spent 30 minutes reviewing the record, speaking to the patient, and entering appropriate documentation.  More than 50% of the time was spent face to face with patient.   _____________________ Angelita Ingles, MD Attending Neonatologist

## 2019-06-11 NOTE — Progress Notes (Signed)
Paged IV team regarding consult order. Was unable to get in touch with anyone. Waiting to hear back.

## 2019-06-11 NOTE — Progress Notes (Signed)
Spoke with Dr Billy Coast at change of shift. D/C US fetal monitoring at pt's bedtime and continue with toco and d/c mag at 2 a.m. Will continue to monitor closely.

## 2019-06-11 NOTE — H&P (Signed)
NAME: Jessica Pierce, Jessica Pierce MEDICAL RECORD ZO:[id number] ACCOUNT [id number] DATE OF BIRTH:07/23/1985 FACILITY: [facility] LOCATION: [facility] PHYSICIAN:Rachell Druckenmiller J. Billy Coast, MD  HISTORY AND PHYSICAL  DATE OF ADMISSION:  06/11/2019  CHIEF COMPLAINT:  Cervical dilatation.  HISTORY OF PRESENT ILLNESS:  She is a 34 year old white female G1 P0, at 26 weeks and 2 days gestation who presents to the office for followup ultrasound with no measurable cervix found to be dilated 4 cm now for admission.  She reports good fetal  movement.  She denies bleeding or leakage of fluid.  She has reported increased vaginal discharge with negative evaluation.  MEDICATIONS:  Citranatal, metformin, baby aspirin, and vitamin B12 supplement.  ALLERGIES:  SHE HAS ALLERGIES TO TRAZODONE AND AUGMENTIN.  FAMILY HISTORY:  Thyroid issues, heart disease, diabetes.    PAST MEDICAL HISTORY:  She has a personal medical history, which is remarkable for obesity, migraine headaches.  Prenatal course to date has been uncomplicated.  PHYSICAL EXAMINATION: GENERAL:  She is a well-developed, well-nourished white female in no acute distress. HEENT:  Normal. NECK:  Supple, full range of motion. LUNGS:  Clear. HEART:  Regular rhythm. ABDOMEN:  Soft, gravid, nontender.  No CVA tenderness. PELVIC:  Reveals the cervix to be 4 cm dilated, 90% effaced, vertex presenting -1-2. EXTREMITIES:  No cords. NEUROLOGIC:  Nonfocal. SKIN:  Intact.  IMPRESSION: 1.  26-week and 2-day intrauterine pregnancy. AGA by sono today. Vertex. 2.  Preterm cervical change.  No evidence of preterm labor.  No history of cervical procedure.  No history of cervical insufficiency. 3.  Morbid obesity. 4.  Polycystic ovarian syndrome with no previous history of diabetes.  PLAN:  Will be to admit, NICU consult, magnesium prophylaxis, betamethasone.  Will admit for inpatient management, monitor signs and symptoms of chorio.  Patient at high risk for preterm birth.  CN/NUANCE   D:06/11/2019 T:06/11/2019 JOB:[id number]

## 2019-06-12 LAB — GROUP B STREP BY PCR: Group B strep by PCR: NEGATIVE

## 2019-06-12 MED ORDER — AZITHROMYCIN 1 G PO PACK: 1.0000 g | PACK | Freq: Once | ORAL | Status: DC

## 2019-06-12 MED ORDER — AZITHROMYCIN 250 MG PO TABS
1000.0000 mg | ORAL_TABLET | Freq: Once | ORAL | Status: AC
  Administered 2019-06-12: 1000 mg via ORAL
  Filled 2019-06-12: qty 4

## 2019-06-12 MED ORDER — CEPHALEXIN 500 MG PO CAPS: 500.0000 mg | ORAL_CAPSULE | Freq: Four times a day (QID) | ORAL | Status: DC

## 2019-06-12 MED ORDER — ONDANSETRON HCL 4 MG PO TABS
4.0000 mg | ORAL_TABLET | Freq: Three times a day (TID) | ORAL | Status: DC | PRN
  Administered 2019-06-12: 4 mg via ORAL
  Filled 2019-06-12: qty 1

## 2019-06-12 MED ORDER — SODIUM CHLORIDE 0.9 % IV SOLN: 2.0000 g | Freq: Four times a day (QID) | INTRAVENOUS | Status: DC

## 2019-06-12 MED ORDER — SIMETHICONE 80 MG PO CHEW
80.0000 mg | CHEWABLE_TABLET | Freq: Four times a day (QID) | ORAL | Status: DC | PRN
  Administered 2019-06-12: 22:00:00 80 mg via ORAL
  Filled 2019-06-12: qty 1

## 2019-06-12 MED ORDER — CEFAZOLIN SODIUM-DEXTROSE 1-4 GM/50ML-% IV SOLN
1.0000 g | Freq: Three times a day (TID) | INTRAVENOUS | Status: DC
  Administered 2019-06-12 (×3): 1 g via INTRAVENOUS
  Filled 2019-06-12 (×5): qty 50

## 2019-06-12 NOTE — Progress Notes (Signed)
Patient ID: Jessica Pierce, female   DOB: 1986/01/10, 34 y.o.   MRN: [id number] HD 2 26w 3d Advanced cervical dilatation  S: No complaints. Good FM . Occ contractions. No bleeding , change in dc or LOF. No CP or SOB.   O: BP (!) 120/55   Pulse 98   Temp 98.1 F (36.7 C) (Oral)   Resp 18   Ht 5\' 9"  (1.753 m)   Wt (!) 155.1 kg   SpO2 99%   BMI 50.50 kg/m   NCAT Neck : supple Lungs: CTA CV: RRR ABD: gravid NT NO CVAT EXT : no cords Neuro: nonfocal Skin: intact  CBC    Component Value Date/Time   WBC 13.5 (H) 06/11/2019 1229   RBC 4.13 06/11/2019 1229   HGB 11.6 (L) 06/11/2019 1229   HCT 36.1 06/11/2019 1229   PLT 336 06/11/2019 1229   MCV 87.4 06/11/2019 1229   MCH 28.1 06/11/2019 1229   MCHC 32.1 06/11/2019 1229   RDW 14.7 06/11/2019 1229    NsT 150s, Reassuring , 10x10 accels, no decels, rare contractions  IMP: 26w 3d IUP Advanced dilatation- no s/s chorio Morbid obesity  P: Continue BMZ series-#2 today NST bid Indocin x 48hrs. Mg neuroprotection done Ancef prophylaxis for GBS until GBS resulted from today NICU consult done

## 2019-06-12 NOTE — Progress Notes (Signed)
Mag, continous fetal monitoring and toco d/c at 0200 per MD verbal order. Will continue to monitor pt closely.

## 2019-06-13 ENCOUNTER — Encounter (HOSPITAL_COMMUNITY): Payer: Self-pay | Admitting: Obstetrics and Gynecology

## 2019-06-13 LAB — CULTURE, OB URINE: Culture: NO GROWTH

## 2019-06-13 MED ORDER — ONDANSETRON HCL 4 MG/2ML IJ SOLN: 4.0000 mg | INTRAMUSCULAR | Status: DC | PRN

## 2019-06-13 MED ORDER — ONDANSETRON HCL 4 MG PO TABS: 4.0000 mg | ORAL_TABLET | ORAL | Status: DC | PRN

## 2019-06-13 MED ORDER — ACETAMINOPHEN 325 MG PO TABS
650.0000 mg | ORAL_TABLET | ORAL | Status: DC | PRN
  Administered 2019-06-13 – 2019-06-15 (×5): 650 mg via ORAL
  Filled 2019-06-13 (×5): qty 2

## 2019-06-13 MED ORDER — TETANUS-DIPHTH-ACELL PERTUSSIS 5-2.5-18.5 LF-MCG/0.5 IM SUSP
0.5000 mL | Freq: Once | INTRAMUSCULAR | Status: AC
  Administered 2019-06-15: 0.5 mL via INTRAMUSCULAR
  Filled 2019-06-13: qty 0.5

## 2019-06-13 MED ORDER — DIPHENHYDRAMINE HCL 25 MG PO CAPS: 25.0000 mg | ORAL_CAPSULE | Freq: Four times a day (QID) | ORAL | Status: DC | PRN

## 2019-06-13 MED ORDER — SENNOSIDES-DOCUSATE SODIUM 8.6-50 MG PO TABS
2.0000 | ORAL_TABLET | ORAL | Status: DC
  Administered 2019-06-14 (×2): 2 via ORAL
  Filled 2019-06-13 (×3): qty 2

## 2019-06-13 MED ORDER — SIMETHICONE 80 MG PO CHEW: 80.0000 mg | CHEWABLE_TABLET | ORAL | Status: DC | PRN

## 2019-06-13 MED ORDER — PRENATAL MULTIVITAMIN CH
1.0000 | ORAL_TABLET | Freq: Every day | ORAL | Status: DC
  Administered 2019-06-13 – 2019-06-15 (×3): 1 via ORAL
  Filled 2019-06-13 (×3): qty 1

## 2019-06-13 MED ORDER — DIBUCAINE (PERIANAL) 1 % EX OINT: 1.0000 "application " | TOPICAL_OINTMENT | CUTANEOUS | Status: DC | PRN

## 2019-06-13 MED ORDER — BENZOCAINE-MENTHOL 20-0.5 % EX AERO
1.0000 "application " | INHALATION_SPRAY | CUTANEOUS | Status: DC | PRN
  Administered 2019-06-14: 1 via TOPICAL
  Filled 2019-06-13: qty 56

## 2019-06-13 MED ORDER — OXYTOCIN 40 UNITS IN NORMAL SALINE INFUSION - SIMPLE MED
INTRAVENOUS | Status: AC
  Filled 2019-06-13: qty 1000

## 2019-06-13 MED ORDER — COCONUT OIL OIL
1.0000 "application " | TOPICAL_OIL | Status: DC | PRN
  Administered 2019-06-13: 1 via TOPICAL

## 2019-06-13 MED ORDER — OXYTOCIN 40 UNITS IN NORMAL SALINE INFUSION - SIMPLE MED: 2.5000 [IU]/h | INTRAVENOUS | Status: DC

## 2019-06-13 MED ORDER — WITCH HAZEL-GLYCERIN EX PADS: 1.0000 "application " | MEDICATED_PAD | CUTANEOUS | Status: DC | PRN

## 2019-06-13 MED ORDER — IBUPROFEN 600 MG PO TABS
600.0000 mg | ORAL_TABLET | Freq: Four times a day (QID) | ORAL | Status: DC
  Administered 2019-06-13 – 2019-06-15 (×10): 600 mg via ORAL
  Filled 2019-06-13 (×11): qty 1

## 2019-06-13 NOTE — Lactation Note (Signed)
This note was copied from a baby's chart. Lactation Consultation Note  Patient Name: Jessica Pierce Today's Date: 06/13/2019  P1, 10 hour preterm female infant born ( 14 w 4 days) infant is in NICU.  Mom with hx: PCOS and obesity. Mom ask for [facility] serivces due questions about DEBP and milk supply.  Per mom, when she pumped few hours ago she experienced pain with her left breast and the DEBP never shut off. LC reviewed how to use DEBP controls and dial functions on pump and mom to pump for 15 minutes on initial setting. LC ask mom to demonstrate how she was using the DEBP and placing her nipples in the  pump flange, LC discussed importance of nipple being in the center of breast  flange and not on the  side of the  flange because this can cause friction and sore nipples. Mom now understands that if her nipple is not center in middle of flange it may cause nipple soreness. Mom was also given coconut oil by her nurse to rub on flange prior to using DEBP. Mom was fitted with correct flange size 24 mm for mom's nipple. LC reviewed pumping every 3 hours for 15 minutes on initial setting. Mom understands to adjust setting to comfort level.  Mom will hand express after pumping for extra stimulation to help establish milk supply. Visually mom will look at video image of infant and infant's voice while pumping or  blanket with infant smell to help with bonding and milk production. Mom knows to call [facility] services if she has any more questions or concerns.   Maternal Data    Feeding    LATCH Score                   Interventions    Lactation Tools Discussed/Used     Consult Status      Danelle Earthly 06/13/2019, 8:21 PM

## 2019-06-13 NOTE — Lactation Note (Signed)
This note was copied from a baby's chart. Lactation Consultation Note  Patient Name: Jessica Pierce Today's Date: 06/13/2019 Reason for consult: Initial assessment;Primapara;1st time breastfeeding;NICU baby;Preterm <34wks;Maternal endocrine disorder Type of Endocrine Disorder?: PCOS  LC in to visit with [name] of [redacted]w[redacted]d infant in the NICU.  Baby 5 hrs old on O2 support currently.  Mom has history of PCOS and obesity which can affect milk supply. Mom reports some breast changes (slight increase in size and nipple tenderness) in early pregnancy.  Breasts appear normal for glandular tissue.  Reviewed and demonstrated breast massage and hand expression.  Mom able to collect small drops of colostrum.  Mom encouraged.  RN to obtain breast milk labels for containers of colostrum.   DEBP set up with instructions on how often and care of pump parts.   Mom encouraged to ask for help prn.  Plan- 1- Breast massage and hand expression often, collecting colostrum in container to take to NICU. 2-Pump both breasts on initiation setting (15 mins) 3- STS with baby when able   Mom does not have a DEBP at home.  Mom advised to call her insurance company about obtaining a DEBP as soon as possible.  Mom informed that she wouldn't want to be discharged without a pump.  Mom aware of a Symphony pump available in baby's room, for her to use.  Mom is also aware of pump rental available from hospital gift shop M-F only.  If insurance pump is delayed, we can offer a loaner DEBP with a $30 deposit, for 12 days.    Mom given NICU booklet and Lactation brochure.  Mom understands there is IP and OP lactation support.  Erskine Squibb Morton's hand expression video web site recommended.   Interventions Interventions: Breast feeding basics reviewed;Skin to skin;Breast massage;Hand express;DEBP  Lactation Tools Discussed/Used Tools: Pump Breast pump type: Double-Electric Breast Pump WIC Program: No Pump Review: Setup,  frequency, and cleaning;Milk Storage Initiated by:: Erby Pian RN IBCLC Date initiated:: 06/13/19   Consult Status Consult Status: Follow-up Date: 06/14/19 Follow-up type: In-patient    [name] 06/13/2019, 4:11 PM

## 2019-06-13 NOTE — Progress Notes (Signed)
Pt c/o low back pain - rolling pain that comes and goes, has noticed 4 times, and last 2 were timed and 10 min apart; states overnight she had low pelvic pain like a menstrual cramp, this happened a couple of times last night and then again this am about an hour prior to seeing the pink/red in urine, no lof, only pink with wiping (of note, I was not notified of contractions/pain/cramps and only discovering this now when speaking with patient; when nurse had called around 8 am, pt was not reporting pain/contractions to current nurse; pt was also not on monitor when having the pain/cramps/ctx)  Temp:  [97.8 F (36.6 C)-98.3 F (36.8 C)] 97.8 F (36.6 C) (03/05 0743) Pulse Rate:  [88-109] 90 (03/05 1031) Resp:  [18] 18 (03/05 0743) BP: (96-146)/(48-65) 119/57 (03/05 1031) SpO2:  [99 %-100 %] 100 % (03/05 0743)  Aox3 nml respirations Abd: soft, nt, nd; morbidly obese SSE: no evidence of bleeding, buldging bag of membranes in vagina Cx: no cervix palpable, ?vertex  Pt immediately brought up to l/d and pt reexamined and u/s performed for presentation U/s: cephalic NICU team called  A/P: iup at 26.4 wga 1. Active labor - pt sent to l/d for delivery; nicu team called for delivery 2. Prematurity - s/p second bmz yesterday afternoon See delivery note

## 2019-06-13 NOTE — Progress Notes (Signed)
Patient reports decrease in ctx during night, however, feeling strong cramps that radiates to the back with a pain rating of 4. Patient reports feels different than ctx.

## 2019-06-13 NOTE — Progress Notes (Addendum)
Pt called out for RN, stating that she was bleeding. Went in to assess the pt and noted pink-tinged urine and a small quarter-sized clot in the urine hat. Pt also wiped a small amount of pinkish-reddish blood on toilet paper. A few spots of pink blood noted on bathroom floor. Pt denies pain and denies tenderness when I palpated her abdomen. Vital signs WDL. Called and notified Dr. Amado Nash at 07:50. MD en route to department to perform a speculum exam. Will continue to monitor.

## 2019-06-14 ENCOUNTER — Encounter (HOSPITAL_COMMUNITY): Payer: Self-pay | Admitting: Obstetrics and Gynecology

## 2019-06-14 LAB — CBC
HCT: 32.7 % — ABNORMAL LOW (ref 36.0–46.0)
Hemoglobin: 10.3 g/dL — ABNORMAL LOW (ref 12.0–15.0)
MCH: 28.5 pg (ref 26.0–34.0)
MCHC: 31.5 g/dL (ref 30.0–36.0)
MCV: 90.3 fL (ref 80.0–100.0)
Platelets: 323 10*3/uL (ref 150–400)
RBC: 3.62 MIL/uL — ABNORMAL LOW (ref 3.87–5.11)
RDW: 15.2 % (ref 11.5–15.5)
WBC: 13 10*3/uL — ABNORMAL HIGH (ref 4.0–10.5)
nRBC: 0 % (ref 0.0–0.2)

## 2019-06-14 NOTE — Plan of Care (Signed)
  Problem: Activity: Goal: Will verbalize the importance of balancing activity with adequate rest periods Outcome: Progressing   

## 2019-06-14 NOTE — Progress Notes (Signed)
PPD1 SVD:   S:  Pt reports feeling  well/ Tolerating po/ Voiding without problems/ No n/v/ Bleeding is light/ Pain controlled withprescription NSAID's including motrin  Newborn info baby in NICU   O:  A & O x 3 well/ VS: Blood pressure 112/61, pulse 76, temperature 97.9 F (36.6 C), temperature source Oral, resp. rate 18, height 5\' 9"  (1.753 m), weight (!) 155.1 kg, SpO2 100 %, unknown if currently breastfeeding.  LABS:  Results for orders placed or performed during the hospital encounter of 06/11/19 (from the past 24 hour(s))  CBC     Status: Abnormal   Collection Time: 06/14/19  5:19 AM  Result Value Ref Range   WBC 13.0 (H) 4.0 - 10.5 K/uL   RBC 3.62 (L) 3.87 - 5.11 MIL/uL   Hemoglobin 10.3 (L) 12.0 - 15.0 g/dL   HCT 08/14/19 (L) 69.6 - 29.5 %   MCV 90.3 80.0 - 100.0 fL   MCH 28.5 26.0 - 34.0 pg   MCHC 31.5 30.0 - 36.0 g/dL   RDW 28.4 13.2 - 44.0 %   Platelets 323 150 - 400 K/uL   nRBC 0.0 0.0 - 0.2 %    I&O: I/O last 3 completed shifts: In: 4444.3 [P.O.:2700; I.V.:1694.3; IV Piggyback:50] Out: 3450 [Urine:3350; Blood:100]   No intake/output data recorded.  Lungs: chest clear, no wheezing, rales, normal symmetric air entry  Heart: regular rate and rhythm, S1, S2 normal, no murmur, click, rub or gallop  Abdomen: soft obese uterus firm at umb  Perineum: is normal  Lochia: light  Extremities:edema 1+    A/P: PPD # 2/ 10.2  Doing well  Continue routine post partum orders  Anticipate d/c in am

## 2019-06-14 NOTE — Lactation Note (Signed)
This note was copied from a baby's chart. Lactation Consultation Note:  Mother request that LC visit in the NICU. She was having concerns about hand expression and discomfort with pumping.   Assist mother with hand expression. Mother hand express 2-3 ml in colostrum vial.   Mother has very red trauma at the back of the left nipple shaft.  Mother reports that she has been turning pump up and that she feels rubbing on the left nipple. Mother is also using a homemade hands free bra.  She is letting her flanges slip .   Assist mother with changing flange to #21 to the Lt side . Mother reports that fit is good.  Mother advised not to turn pump up beyond standard.  Encouraged mother to continue to hand express before and after pumping.  Mother very tired. Lots of support and praise given to mother.  Mother receptive to all teaching.  Mother to continue to pump every 2-3 hours for 15-20 mins.  Mother to nap frequently . She is aware that she may page for [facility] as needed.    Patient Name: Jessica Pierce Today's Date: 06/14/2019 Reason for consult: Follow-up assessment;Mother's request Type of Endocrine Disorder?: PCOS   Maternal Data    Feeding    LATCH Score                   Interventions Interventions: Hand express;DEBP  Lactation Tools Discussed/Used     Consult Status Consult Status: Follow-up Date: 06/15/19 Follow-up type: In-patient    Stevan Born [facility] 06/14/2019, 3:13 PM

## 2019-06-14 NOTE — Lactation Note (Signed)
This note was copied from a baby's chart. Lactation Consultation Note  Patient Name: Jessica Pierce Today's Date: 06/14/2019 Reason for consult: Follow-up assessment;Preterm <34wks P1, 35 hour preterm female infant at (68w5days). Per mom, she has questions about DEBP, LC replaced pump parts due to condensation and mom's anxiety.  LC reviewed hand expression again at mother's request, mom expressed additional 1 ml of colostrum that was added to a label bullet dad is taking to NICU. LC referred mom again to Laverle Hobby  youtube video on hand expression.  Mom is more comfortable with using the 24 mm breast flange when using DEBP. Mom will pump every 3 hours for 15 minutes on initial setting and hand express afterwards for breast stimulation and to help establish milk supply.   Maternal Data    Feeding    LATCH Score                   Interventions    Lactation Tools Discussed/Used     Consult Status Consult Status: Follow-up Date: 06/15/19 Follow-up type: In-patient    Danelle Earthly 06/14/2019, 10:01 PM

## 2019-06-15 MED ORDER — IBUPROFEN 600 MG PO TABS: 600.0000 mg | ORAL_TABLET | Freq: Four times a day (QID) | ORAL | 5 refills | Status: DC | PRN

## 2019-06-15 NOTE — Progress Notes (Signed)
PPD #2  S: no complaint  O: BP 127/62 (BP Location: Left Wrist)   Pulse 89   Temp 98.2 F (36.8 C) (Oral)   Resp 18   Ht 5\' 9"  (1.753 m)   Wt (!) 155.1 kg   SpO2 100%   Breastfeeding Unknown   BMI 50.50 kg/m  Lungs clear to A Cor RRR  breast currently pumping Abd: obese soft uterus at umb Pad just showered  IMP: PPD #2 doing well P) dc/ home d/c instructions reviewed  f/u 6 wk

## 2019-06-15 NOTE — Discharge Summary (Signed)
OB Discharge Summary     Patient Name: Jessica Pierce DOB: Jan 22, 1986 MRN: [id number]  Date of admission: 06/11/2019 Delivering MD: Tiana Loft E   Date of discharge: 06/15/2019  Admitting diagnosis: Preterm labor [O60.00] Intrauterine pregnancy: [redacted]w[redacted]d     Secondary diagnosis:  Principal Problem:   Postpartum care following vaginal delivery Active Problems:   Preterm labor  Additional problems: morbid obesity PCOS     Discharge diagnosis: Preterm Pregnancy Delivered and morbid obesity                                                                                                 Post partum procedures: none  Augmentation:  none  Complications: None  Hospital course:  Onset of Labor With Vaginal Delivery     34 y.o. yo G1P0101 at [redacted]w[redacted]d was admitted with preterm cervical dilation on 06/11/2019.  Pt was started on magnesium sulfate, BMZ, nicu consult. Patient had an uncomplicated labor course as follows:  Membrane Rupture Time/Date: 10:02 AM ,06/13/2019   Intrapartum Procedures: Episiotomy: None [1]                                         Lacerations:    Patient had a delivery of a Viable infant. 06/13/2019  Information for the patient's newborn:  Jordy, Hewins Boy Seidy [[id number]]       Pateint had an uncomplicated postpartum course.  She is ambulating, tolerating a regular diet, passing flatus, and urinating well. Patient is discharged home in stable condition on 06/16/19.   Physical exam  Vitals:   06/14/19 1706 06/14/19 2111 06/15/19 0406 06/15/19 0757  BP: (!) 128/56 137/77 (!) 118/53 127/62  Pulse: 94 84 94 89  Resp: 20 19 18 18   Temp: 98.1 F (36.7 C) 98.4 F (36.9 C) 97.9 F (36.6 C) 98.2 F (36.8 C)  TempSrc: Oral Oral Oral Oral  SpO2: 100% 98% 100% 100%  Weight:      Height:       General: alert, cooperative and no distress Lochia: appropriate Uterine Fundus: firm Incision: N/A DVT Evaluation: No evidence of DVT seen on physical exam. Calf/Ankle edema is  present Labs: Lab Results  Component Value Date   WBC 13.0 (H) 06/14/2019   HGB 10.3 (L) 06/14/2019   HCT 32.7 (L) 06/14/2019   MCV 90.3 06/14/2019   PLT 323 06/14/2019   CMP Latest Ref Rng & Units 06/11/2019  Glucose 70 - 99 mg/dL 104(H)  BUN 6 - 20 mg/dL <5(L)  Creatinine 0.44 - 1.00 mg/dL 0.51  Sodium 135 - 145 mmol/L 138  Potassium 3.5 - 5.1 mmol/L 3.6  Chloride 98 - 111 mmol/L 108  CO2 22 - 32 mmol/L 21(L)  Calcium 8.9 - 10.3 mg/dL 8.8(L)  Total Protein 6.5 - 8.1 g/dL 6.4(L)  Total Bilirubin 0.3 - 1.2 mg/dL 0.6  Alkaline Phos 38 - 126 U/L 49  AST 15 - 41 U/L 19  ALT 0 - 44 U/L 24    Discharge instruction: per  After Visit Summary and "Baby and Me Booklet".  After visit meds:  Allergies as of 06/15/2019       Reactions   Augmentin [amoxicillin-pot Clavulanate] Itching        Medication List     STOP taking these medications    aspirin EC 81 MG tablet       TAKE these medications    calcium carbonate 500 MG chewable tablet Commonly known as: TUMS - dosed in mg elemental calcium Chew 2 tablets by mouth 2 (two) times daily as needed for indigestion or heartburn.   ibuprofen 600 MG tablet Commonly known as: ADVIL Take 1 tablet (600 mg total) by mouth every 6 (six) hours as needed.   metFORMIN 1000 MG tablet Commonly known as: GLUCOPHAGE Take 500 mg by mouth 2 (two) times daily with a meal.   prenatal multivitamin Tabs tablet Take 1 tablet by mouth at bedtime.        Diet: carb modified diet  Activity: Advance as tolerated. Pelvic rest for 6 weeks.   Outpatient follow up:6 weeks Follow up Appt:No future appointments. Follow up Visit:No follow-ups on file.  Postpartum contraception: Not Discussed  Newborn Data: Live born female  Birth Weight: 3 lb 5.6 oz (1520 g) APGAR: 8, 9  Newborn Delivery   Birth date/time: 06/13/2019 10:18:00 Delivery type: Vaginal, Spontaneous      Baby Feeding: Breast Disposition:NICU   06/15/2019 Serita Kyle, MD

## 2019-06-15 NOTE — Lactation Note (Signed)
This note was copied from a baby's chart. Lactation Consultation Note:  I arrived in mothers room to see mother hand express into a colostrum vial.  Mother began pumping while I was in the room. Mother is now using the #24 flanges. Mother still has red ring behind nipple shaft. Mother reports that she still has slight stinging pain when pumping.   Cautioned mother to massage breast lightly . Mother has some bruising on the tops of her breast.  Mother plans to be discharged this p,m. She plans to go home for a few things and then stay in the NICU with infant overnight.  Mother to use the [employer] in the NICU and rent from [employer]. Mother reminded to phone her Insurance company.   Mother advised to follow up with [facility] services as needed.    Patient Name: Jessica Pierce Today's Date: 06/15/2019 Reason for consult: Follow-up assessment Type of Endocrine Disorder?: PCOS   Maternal Data    Feeding    LATCH Score                   Interventions    Lactation Tools Discussed/Used     Consult Status Consult Status: Follow-up Date: 06/16/19 Follow-up type: In-patient    Stevan Born [facility] 06/15/2019, 3:40 PM

## 2019-06-15 NOTE — Discharge Instructions (Signed)
Postpartum Care After Vaginal Delivery This sheet gives you information about how to care for yourself from the time you deliver your baby to up to 6-12 weeks after delivery (postpartum period). Your health care provider may also give you more specific instructions. If you have problems or questions, contact your health care provider. Follow these instructions at home: Vaginal bleeding  It is normal to have vaginal bleeding (lochia) after delivery. Wear a sanitary pad for vaginal bleeding and discharge. ? During the first week after delivery, the amount and appearance of lochia is often similar to a menstrual period. ? Over the next few weeks, it will gradually decrease to a dry, yellow-brown discharge. ? For most women, lochia stops completely by 4-6 weeks after delivery. Vaginal bleeding can vary from woman to woman.  Change your sanitary pads frequently. Watch for any changes in your flow, such as: ? A sudden increase in volume. ? A change in color. ? Large blood clots.  If you pass a blood clot from your vagina, save it and call your health care provider to discuss. Do not flush blood clots down the toilet before talking with your health care provider.  Do not use tampons or douches until your health care provider says this is safe.  If you are not breastfeeding, your period should return 6-8 weeks after delivery. If you are feeding your child breast milk only (exclusive breastfeeding), your period may not return until you stop breastfeeding. Perineal care  Keep the area between the vagina and the anus (perineum) clean and dry as told by your health care provider. Use medicated pads and pain-relieving sprays and creams as directed.  If you had a cut in the perineum (episiotomy) or a tear in the vagina, check the area for signs of infection until you are healed. Check for: ? More redness, swelling, or pain. ? Fluid or blood coming from the cut or tear. ? Warmth. ? Pus or a bad  smell.  You may be given a squirt bottle to use instead of wiping to clean the perineum area after you go to the bathroom. As you start healing, you may use the squirt bottle before wiping yourself. Make sure to wipe gently.  To relieve pain caused by an episiotomy, a tear in the vagina, or swollen veins in the anus (hemorrhoids), try taking a warm sitz bath 2-3 times a day. A sitz bath is a warm water bath that is taken while you are sitting down. The water should only come up to your hips and should cover your buttocks. Breast care  Within the first few days after delivery, your breasts may feel heavy, full, and uncomfortable (breast engorgement). Milk may also leak from your breasts. Your health care provider can suggest ways to help relieve the discomfort. Breast engorgement should go away within a few days.  If you are breastfeeding: ? Wear a bra that supports your breasts and fits you well. ? Keep your nipples clean and dry. Apply creams and ointments as told by your health care provider. ? You may need to use breast pads to absorb milk that leaks from your breasts. ? You may have uterine contractions every time you breastfeed for up to several weeks after delivery. Uterine contractions help your uterus return to its normal size. ? If you have any problems with breastfeeding, work with your health care provider or Advertising copywriter.  If you are not breastfeeding: ? Avoid touching your breasts a lot. Doing this can  make your breasts produce more milk. ? Wear a good-fitting bra and use cold packs to help with swelling. ? Do not squeeze out (express) milk. This causes you to make more milk. Intimacy and sexuality  Ask your health care provider when you can engage in sexual activity. This may depend on: ? Your risk of infection. ? How fast you are healing. ? Your comfort and desire to engage in sexual activity.  You are able to get pregnant after delivery, even if you have not had  your period. If desired, talk with your health care provider about methods of birth control (contraception). Medicines  Take over-the-counter and prescription medicines only as told by your health care provider.  If you were prescribed an antibiotic medicine, take it as told by your health care provider. Do not stop taking the antibiotic even if you start to feel better. Activity  Gradually return to your normal activities as told by your health care provider. Ask your health care provider what activities are safe for you.  Rest as much as possible. Try to rest or take a nap while your baby is sleeping. Eating and drinking   Drink enough fluid to keep your urine pale yellow.  Eat high-fiber foods every day. These may help prevent or relieve constipation. High-fiber foods include: ? Whole grain cereals and breads. ? Brown rice. ? Beans. ? Fresh fruits and vegetables.  Do not try to lose weight quickly by cutting back on calories.  Take your prenatal vitamins until your postpartum checkup or until your health care provider tells you it is okay to stop. Lifestyle  Do not use any products that contain nicotine or tobacco, such as cigarettes and e-cigarettes. If you need help quitting, ask your health care provider.  Do not drink alcohol, especially if you are breastfeeding. General instructions  Keep all follow-up visits for you and your baby as told by your health care provider. Most women visit their health care provider for a postpartum checkup within the first 3-6 weeks after delivery. Contact a health care provider if:  You feel unable to cope with the changes that your child brings to your life, and these feelings do not go away.  You feel unusually sad or worried.  Your breasts become red, painful, or hard.  You have a fever.  You have trouble holding urine or keeping urine from leaking.  You have little or no interest in activities you used to enjoy.  You have not  breastfed at all and you have not had a menstrual period for 12 weeks after delivery.  You have stopped breastfeeding and you have not had a menstrual period for 12 weeks after you stopped breastfeeding.  You have questions about caring for yourself or your baby.  You pass a blood clot from your vagina. Get help right away if:  You have chest pain.  You have difficulty breathing.  You have sudden, severe leg pain.  You have severe pain or cramping in your lower abdomen.  You bleed from your vagina so much that you fill more than one sanitary pad in one hour. Bleeding should not be heavier than your heaviest period.  You develop a severe headache.  You faint.  You have blurred vision or spots in your vision.  You have bad-smelling vaginal discharge.  You have thoughts about hurting yourself or your baby. If you ever feel like you may hurt yourself or others, or have thoughts about taking your own life, get  help right away. You can go to the nearest emergency department or call:  Your local emergency services (911 in the [place]  A suicide crisis helpline, such as the National Suicide Prevention Lifeline at 339-460-5526. This is open 24 hours a day. Summary  The period of time right after you deliver your newborn up to 6-12 weeks after delivery is called the postpartum period.  Gradually return to your normal activities as told by your health care provider.  Keep all follow-up visits for you and your baby as told by your health care provider. This information is not intended to replace advice given to you by your health care provider. Make sure you discuss any questions you have with your health care provider. Document Revised: 03/30/2017 Document Reviewed: 01/08/2017 Elsevier Patient Education  2020 [employer]. Call if temperature greater than equal to 100.4, nothing per vagina for 4-6 weeks or severe nausea vomiting, increased incisional pain , drainage or redness in  the incision site, no straining with bowel movements, showers no bath

## 2019-06-16 ENCOUNTER — Ambulatory Visit: Payer: Self-pay

## 2019-06-16 LAB — SURGICAL PATHOLOGY

## 2019-06-16 NOTE — Lactation Note (Signed)
This note was copied from a baby's chart. Lactation Consultation Note  Patient Name: Jessica Pierce Today's Date: 06/16/2019   Infant is 16 hrs old. Mom still does not have a pump for home use, but she has been using the Symphony at her son's bedside since she is spending the night in his NICU room. I gave Mom the phone # for the gift shop & she says she will call her insurance company tomorrow.  Mom is now expressing 15-21 mL with her pumping sessions, which is a considerable increase from what she had been able to pump. Mom feels that size 21 flanges are now more comfortable for her.    Lurline Hare [facility] 06/16/2019, 5:04 PM

## 2019-06-17 ENCOUNTER — Ambulatory Visit: Payer: Self-pay

## 2019-06-17 NOTE — Lactation Note (Signed)
This note was copied from a baby's chart. Lactation Consultation Note  Patient Name: Boy Buffey Mathew Today's Date: 06/17/2019  Telephone call from RN reporting mom wants to be seen from Lactation. Entered room and mom pumping on one side.  Dad by her side and mom visibly crying.  Mom reports she cant get the flanges to keep leaking milk unless she leans forward slightly.  Explained to mom that is what you have to to is lean forward slightly with pumping to catch the milk. That she actually figured it out on her own.    Explained to moms some moms single pump to make it easier to massage/compress/pump with DEBP in the beginnings of pumping.  Mom reports flange got off center on right and showed where she had pumped her areola and had swelling on the side of her nipple on areola.  Urged hand expression/pat expressed mothers milk on nipples/air dry.  Discussed using food grade organic coconut oil or extra virgin olive oil with pumping . Mom started getting calm and smiling and apologized for being upset. Explained to mom it was fine.  We were here to try and help her figure it out and support her.  To call lactation as needed.   Maternal Data    Feeding Feeding Type: Breast Milk  LATCH Score                   Interventions    Lactation Tools Discussed/Used     Consult Status      Yanely Mast Michaelle Copas 06/17/2019, 10:20 PM

## 2019-06-17 NOTE — Lactation Note (Signed)
This note was copied from a baby's chart. Lactation Consultation Note  Patient Name: Jessica Pierce Today's Date: 06/17/2019 Reason for consult: Follow-up assessment   LC Follow Up Visit:  RN requested LC visit due to mother having questions:  Arrived to her room at 1700.  Mother has been having difficulty with milk leaking out the bottom of her flanges when she is pumping.  She feels like she is holding the flanges securely, however, she continues to leak.  Offered to observe her pump to determine the cause of this leakage.  Mother appreciative.  Mother's breasts are very large and nipples are intact.  Mother has been using #21 flanges.  Discussed pump setting and suction pressure with mother.  Assisted to hold comfortably against her skin and initiated the DEBP.  Mother concerned about being able to pump when she is alone and hold the flanges at the same time.  Helpful hints provided.  Observed her pumping for 15 minutes while discussing pumping basics.  She has been obtaining approximately 15-20 mls at a session and is pumping every three hours.  She did admit to going 4 1/2 hours one time last night.  During pumping her left nipple began to swell more than her right one.  Both nipples are very close to rubbing on the inside of her flange.  Cautioned her to be aware of this and to graduate to her #24 flange size as needed.  Mother denied pain with pumping and has had trial periods of experimenting with the #21 and #24 flange size.  Suggested she observe every session to be sure she is using the correct size.  Mother's left nipple more sensitive than the right one.  She is using EBM to rub into nipples/areolas after pumping.  Mother was able to obtain 27 mls this session and was happy to see her volume go up again.   Her breasts are filling and a few knotty spots felt at the upper aspect of the breasts.  Warm packs provided and mother will massage, hand express and pump for another 10 minutes.   Informed her of how and when to use ice packs if needed for swelling.    Mother still does not have a DEBP for home use and has been provided information by previous LCs about renting and calling the insurance company.  Father present.  Mother has been staying full time with baby in the NICU.  Father goes home periodically to complete tasks at home.  RN updated.                Consult Status Consult Status: PRN Date: 06/17/19 Follow-up type: Call as needed    Irene Pap Reily Treloar 06/17/2019, 5:53 PM

## 2019-06-21 ENCOUNTER — Ambulatory Visit: Payer: Self-pay

## 2019-06-21 NOTE — Lactation Note (Signed)
This note was copied from a baby's chart. Lactation Consultation Note  Patient Name: Jessica Pierce Today's Date: 06/21/2019 Reason for consult: Follow-up assessment;Mother's request;NICU baby;Preterm <34wks Type of Endocrine Disorder?: PCOS Interventions Interventions: DEBP  1241 - 1308 - Jessica Pierce paged lactation to check on her DEBP. She feels that one side of her pump is not working properly. We did a test pump, and I tested her tubing and both sides of her DEBP. I also switched out parts to check to see if there is a difference. I then found another DEBP from an empty room and compared the pump pressure. All equipment appeared to be functioning. Jessica Pierce agreed.  We determined that possibly during this pump, the white membrane may not have been fully attached. Jessica Pierce was not aware this could cause an issue. I recommended if she had this trouble again to take her pieces apart and re-attach them.   Her pump parts were in water in the basin soaking. I recommended that she pour out the water and wash and air dry her pieces.  Jessica Pierce is getting about 30 mls combined at her morning pump, but less later in the day. We discussed strategies for increasing her production, and I encouraged her to be very consistent with her eight pumps a day, including at night.  She asked about her PCOS and history of infertility. I stated that PCOS has been correlated with decrease in her milk production. She asked if there were any medications she could take to assist with  Milk production. I encouraged her to call her OB to see if she would be a candidate for nifedipine. We also discussed some of the herbs, particularly moringa, and lactogenic foods.  All questions answered at this time. Jessica Pierce would appreciate a lactation follow up early next week, and I put in the follow up date as 3/16.  Lactation Tools Discussed/Used Tools: Pump Breast pump type: Double-Electric Breast Pump Pump Review: Setup,  frequency, and cleaning   Consult Status Consult Status: Follow-up Date: 06/24/19 Follow-up type: In-patient    Walker Shadow 06/21/2019, 1:22 PM

## 2019-06-23 ENCOUNTER — Ambulatory Visit: Payer: Self-pay

## 2019-06-23 NOTE — Lactation Note (Signed)
This note was copied from a baby's chart. Lactation Consultation Note  Patient Name: Jessica Pierce Today's Date: 06/23/2019 Reason for consult: Follow-up assessment  P1 mother whose infant is now 27 days old.  This is a preterm baby born at 26+4 weeks with a CGA of 28+0 weeks, weighing < 6 lbs and in the NICU.  Mother requested LC visit for questions:  Mother was concerned about her sudden drop in volume when she pumps.  She has a history of PCOS but had been obtaining approximately 50 mls of EBM per pumping session until the last 24 hour period.  "All of a sudden" her milk volume has decreased to 20 mls/session.  Verbal history completed with mother and determined nothing has changed in her routine.  She has been conscientious about eating, hydrating, sleeping and trying to lessen her stress level while here in the NICU.  I asked her if she had consulted her OB regarding any possible medications to assist and she is currently awaiting a phone call from her office.  Since it is time for mother to pump again I suggested trying to pump on the Initiation setting since her volume has been so low to determine if this may make a difference.  Observed flange size and mother is using the correct size at the current time.  She likes to pump one breast at a time because she has a difficult time holding both flanges and uses one hand to massage during pumping.  Suggested mother get comfortable and I would leave her an hour to complete her pumping session.  When I return we will see if changing the setting makes any difference in her volume.  RN updated.   Maternal Data    Feeding Feeding Type: Breast Milk  LATCH Score                   Interventions    Lactation Tools Discussed/Used     Consult Status Consult Status: PRN Date: 06/23/19 Follow-up type: Call as needed    Kaizer Dissinger R Ovida Delagarza 06/23/2019, 2:18 PM

## 2019-06-23 NOTE — Lactation Note (Signed)
This note was copied from a baby's chart. Lactation Consultation Note  Patient Name: Boy Marlette Bence Today's Date: 06/23/2019 Reason for consult: Follow-up assessment  Follow Up LC Visit:  Returned an hour later as discussed from previous visit.  Mother was only able to pump 20 mls so changing the mode did not help her produce more volume.  Discussed trying power pumping with her.  Mother willing to try this.  She has been trying "everything she can" to increase milk supply.  Power pumping explained and plan written out for mother to follow.  She will continue all other methods including hand expression, breast massage, adequate hydration and sleep.  Suggested mother spend a night at home and more time away from the NICU to give herself a break.  Mother not completely comfortable yet doing this.  She has spent very little time away from the NICU and has been here every night and most of every day since baby's delivery.  Discussed mental and emotional well being and mother may try to spend Thursday night at home.  Today is Monday so encouraged her to do this.  She knows she can call as needed to find out about her baby's condition.  Mother will power pump through Thursday.  Will put her on the NICU follow up sheet so a lactation consultant can follow up on Thursday to see if any progress has been made regarding volume amounts obtained.  Mother received a return phone call from her OB.  She will pick up a prescription for Reglan but wants to try the power pumping first.  The OB office also suggested Fenugreek which mother will purchase along with lactation cookies that were suggested by a friend of hers.  RN updated.   Maternal Data    Feeding Feeding Type: Breast Milk  LATCH Score                   Interventions    Lactation Tools Discussed/Used     Consult Status Consult Status: PRN Date: 06/23/19 Follow-up type: Call as needed    Berma Harts R Anniebelle Devore 06/23/2019, 2:30  PM

## 2019-06-26 ENCOUNTER — Ambulatory Visit: Payer: Self-pay

## 2019-06-26 NOTE — Lactation Note (Signed)
This note was copied from a baby's chart. Lactation Consultation Note  Patient Name: Jessica Pierce Today's Date: 06/26/2019 Reason for consult: Mother's request  Mom had contacted her insurance company and wanted  insight into the options her insurance co gave her (2 were Medela pumps & 2 were Spectra pumps). I invited Mom to come with me to the pump closet where we keep employee pumps & I showed her the differences between the Freestyle Flex & the Medela Pump in Style Max Flow.   Mom will do some more research on her Spectra options. I explained to Mom that she may like how the Spectra pump pulls her breast into the flange (Mom has had frustration in pumping with having small nipples & large breasts). I also mentioned Pumpin' Pals & wrote that down on the whiteboard in her infant's NICU room.   Lurline Hare [facility] 06/26/2019, 12:54 PM

## 2019-06-27 ENCOUNTER — Ambulatory Visit: Payer: Self-pay

## 2019-06-27 NOTE — Lactation Note (Signed)
This note was copied from a baby's chart. Lactation Consultation Note  Patient Name: Jessica Pierce Today's Date: 06/27/2019 Reason for consult: NICU baby;Initial assessment;Maternal endocrine disorder;Mother's request Type of Endocrine Disorder?: PCOS  Visited with mom of a 56 week old pre-term NICU infant, she had requested an [facility] consultation because she had a question regarding her pump. She got a used Medela pump & style but it was missing the tubing on the kit. Informed mom that we can provide a new pumping kit for her Medela pump so she can stay at home longer and not having to come to the hospital to keep up with her pumping session every time she has to pump.  Per mom, she's getting between 20-40 ml on each pumping session, and about 50 ml when she's power pumping, she power pumps twice a day. She's still concerned about her supply and will continue working towards increasing her supply. LC offered to bring a new DEBP kit for her personal pump but she declined when she found out she can not get "just the tubing" but the entire kit, she was concerned about the cost. She told LC she'll look online first to see if she can get just the tubing, if not, she'll request lactation again to do the DEBP kit. Using flange sizes # 21 and reports it is working for her so far.  Asked mom to call PRN as needed or whenever she made up her mind. She understands how important is to have a DEBP pump available to protect her supply. Mom reported all questions and concerns were answered, she's aware of [facility] and will call PRN.  Maternal Data    Feeding Feeding Type: Breast Milk  LATCH Score                   Interventions Interventions: Breast feeding basics reviewed;DEBP  Lactation Tools Discussed/Used Tools: Pump Breast pump type: Double-Electric Breast Pump   Consult Status Consult Status: PRN Follow-up type: In-patient    [name] Francene Boyers 06/27/2019, 1:58 PM

## 2019-06-30 ENCOUNTER — Ambulatory Visit: Payer: Self-pay

## 2019-06-30 NOTE — Lactation Note (Signed)
This note was copied from a baby's chart. Lactation Consultation Note  Patient Name: Jessica Pierce Today's Date: 06/30/2019 Reason for consult: Follow-up assessment;Mother's request;NICU baby;Preterm <34wks;Infant < 6lbs;Maternal endocrine disorder Type of Endocrine Disorder?: PCOS  Mom requesting lactation consult regarding her flanges when she is pumping.   Mom pumping using the Symphony DEBP and 21 mm flanges.  Mom has small diameter nipples and areola is being pulled into tunnel of flange causing a red ring when she is done pumping.  Mom denies discomfort.  Suspect elastic tissue as breast tissue noted to be filling the tunnel in the flange.  Mom plans to purchase "Pumping Pals" on recommendation of another LC.  Talked about the new Personal Fit Flex Breast Dorris Carnes that Arta Bruce has come out with that comforts to breast shape better.  Mom also concerned with her milk supply.  Baby is 4 days old.  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  Mom was expressing 2-4 oz each pumping, and now she is expressing about 30 ml.  Mom was given a Rx for Metformin to help with milk supply.  Mom also has a Rx for Reglan.  Advised Mom to try one medication at a time.  Mom is pumping >8 times per 24 hrs.  She has only gone home twice for a couple hours.  Talked about benefits of sleep with regards to milk supply.  Suggested wet wash cloth and warm heating pad prior to pumping and doing breast massage.  Mom has a hand's free bra for pumping, encouraged breast compression during pumping.  Mom advised not to push breast shield into breast during pumping, but to be sitting upright with pillow to her back and good arm supply if she is holding the pump to her breast.    Praised Mom for her commitment to providing EBM for baby.  Mom is meeting baby's needs currently and encouraged her not to take care of herself.  Mom knows to call prn for assistance.  Interventions Interventions: Breast feeding  basics reviewed;Expressed milk;Breast massage;Hand express;Skin to skin;Support pillows;DEBP;Coconut oil  Lactation Tools Discussed/Used Tools: Pump;Coconut oil Breast pump type: Double-Electric Breast Pump   Consult Status Consult Status: Follow-up Date: 07/07/19 Follow-up type: In-patient    Jessica Pierce 06/30/2019, 4:57 PM

## 2019-07-03 ENCOUNTER — Ambulatory Visit: Payer: Self-pay

## 2019-07-03 NOTE — Lactation Note (Signed)
This note was copied from a baby's chart. Lactation Consultation Note  Patient Name: Jessica Pierce Today's Date: 07/03/2019  [name] is 32 weeks old in the NICU.([id number])  Mom is currently pumping.  She is concerned about her supply.  She pumps 20-60 mls each pumping but average pumping is .  She pumps 8-12 times per day using a symphony pump..  Most pumpings she uses standard setting but she occasionally uses initiation phase.  She does not see a difference in volume between the settings.  Praised mom for her great efforts with pumping.  She had questions about fenugreek and moringa.  Written information given to mom.   Maternal Data    Feeding Feeding Type: Breast Milk  LATCH Score                   Interventions    Lactation Tools Discussed/Used     Consult Status      [name] 07/03/2019, 1:51 PM

## 2019-07-04 ENCOUNTER — Ambulatory Visit: Payer: Self-pay

## 2019-07-04 NOTE — Lactation Note (Signed)
This note was copied from a baby's chart. Lactation Consultation Note  Patient Name: Jessica Pierce Today's Date: 07/04/2019 Reason for consult: Follow-up assessment;1st time breastfeeding;Primapara;NICU baby;Maternal endocrine disorder Type of Endocrine Disorder?: PCOS  LC in to talk with [name] of baby in the NICU.  Baby 28 weeks old and AGA [redacted]w[redacted]d.    Mom is concerned about her milk supply and restarted her Metformin Rx advised by her OB.  Mom had questions about starting fenugreek and moringa herbal supplements at the same time.  Advised Mom to start one supplement at a time.  Mom to try moringa and 2 weeks before trying something else.   Mom did sleep longer last night without getting up to pump, and she feels much better today.  She plans to cluster pump today to try to pump as many times.    Mom using a soft silicon insert in her flange (MayMom) and pumping has become more comfortable.   Interventions Interventions: Breast feeding basics reviewed;DEBP;Expressed milk;Hand express;Breast massage  Lactation Tools Discussed/Used Tools: Pump;Coconut oil Breast pump type: Double-Electric Breast Pump   Consult Status Consult Status: Follow-up Date: 07/10/19 Follow-up type: In-patient    [name] 07/04/2019, 2:49 PM

## 2019-07-07 ENCOUNTER — Ambulatory Visit: Payer: Self-pay

## 2019-07-07 NOTE — Lactation Note (Signed)
This note was copied from a baby's chart. Lactation Consultation Note  Patient Name: Jessica Pierce Today's Date: 07/07/2019   Mom reports being able to pump 20-25 mL/pumping session. Mom is pleased with using her size 17 Maymom flange inserts (which insert inside of size 24 flanges). Mom says that this is more comfortable, as the size 21 flanges were too large for her.   Mom needed an extra yellow valve for her DEBP kit. I called our gift shop on the ground floor & then provided her with a hand pump so she could utilize that valve while she waits for her [place] from [place] to arrive.    Matthias Hughs [facility] 07/07/2019, 2:30 PM

## 2019-07-17 ENCOUNTER — Ambulatory Visit: Payer: Self-pay

## 2019-07-17 NOTE — Lactation Note (Signed)
This note was copied from a baby's chart. Lactation Consultation Note  Patient Name: Jessica Pierce Today's Date: 07/17/2019 Reason for consult: Follow-up assessment;Mother's request;NICU baby;1st time breastfeeding Type of Endocrine Disorder?: PCOS   1553 - 1638 - Follow up with Ms. Teutsch. She brought me up to date on her breast pumping history with her now 20 week old (10 and 3 PMA) son. She is having insufficient milk production, and she wants to review her plan.  She has been recently pumping about 6 times a day. She is taking 1000 mg. Metformin 2x a day for the last two weeks. She has PCOS as well and a history of infertility.  She has been taking 3000 mg of Moringa (one capsule) once a day since March 23. She also has begun a regimen of Fenugreek. She has been building up to two 610 mg pills three times a day. Last night she took 5 pills because she read online that if her body weight was over 150 lbs that she should take more. She became extremely nauseated over night.   She is expressing about 130 mls a day. She is very discouraged. She has supplements, is eating oatmeal every day, pumping with a symphony pump at the hospital and at home, has bought pumping pals, etc.  We discussed a plan to see what we could do to increase her supply. I did counsel her that her increases may be marginal at four weeks out. Ms. Pancake is aware that her health history may factor in. She also has a prescription for Reglan, but she wants to begin taking it when baby is ready to latch.   Ms. Greeno will pump 8 times a day for the next four days and continue with moringa. She will discontinue fenugreek at this time due to the side effects. We will check back in on 4/14. Ms. Paulson had some questions about frozen breast milk with HMF in it; I offered to do some research and try to have some information at our next visit.   Feeding Feeding Type: Breast Milk  Interventions Interventions: Breast feeding basics  reviewed  Lactation Tools Discussed/Used Breast pump type: Double-Electric Breast Pump Pump Review: Setup, frequency, and cleaning Initiated by:: hl   Consult Status Consult Status: Follow-up Date: 07/23/19 Follow-up type: In-patient    Walker Shadow 07/17/2019, 5:50 PM

## 2019-07-19 ENCOUNTER — Ambulatory Visit: Payer: Self-pay

## 2019-07-19 NOTE — Lactation Note (Signed)
This note was copied from a baby's chart. Lactation Consultation Note  Patient Name: Boy Alegria Routt Today's Date: 07/19/2019 Reason for consult: Follow-up assessment;Mother's request;NICU baby;1st time breastfeeding;Infant < 6lbs;Preterm <34wks Type of Endocrine Disorder?: PCOS(and Diabetes)  Ms. Mckenna called for follow up. She asked for [facility] as well as myself for purposes of continuity of care.   Ms. Mentel is exploring the possibility of using donor breast milk for her son in addition to her EBM due to challenges with milk production. She has been going to some informal milk sharing sites and wanted to ask some questions about screening candidates for donation. She also had some questions about how long to store milk in different freezer types.  At this time, Ms. Clevinger has discontinued use of herbal galactagogues because she reports the need to take a break from them. She has a prescription for Reglan that she has filled.   We reviewed some best practices for pumping while in room. All questions answered at this time.  Maternal Data Formula Feeding for Exclusion: No  Feeding Feeding Type: Breast Milk   Interventions Interventions: Breast feeding basics reviewed  Lactation Tools Discussed/Used     Consult Status Consult Status: Follow-up Follow-up type: Call as needed    Walker Shadow 07/19/2019, 6:27 PM

## 2019-07-21 ENCOUNTER — Ambulatory Visit: Payer: Self-pay

## 2019-07-21 NOTE — Lactation Note (Signed)
This note was copied from a baby's chart. Lactation Consultation Note  Patient Name: Jessica Pierce Today's Date: 07/21/2019 Reason for consult: Follow-up assessment;Primapara;1st time breastfeeding;NICU baby;Preterm <34wks;Maternal endocrine disorder Type of Endocrine Disorder?: PCOS  LC in to speak to [name] of 8 week old baby, AGA 32 wks.    Mom struggles with her milk supply, total volume per day 120 ml after pumping 8 times per day.  Mom is currently on Metformin and just DC'd her Fenugreek supplements due to GI side effects.  Mom states she is currently taking Moringa with little effect on milk supply.  Mom has Rx for Reglan, wanted to wait until baby was closer to being able to latch, to start taking it.  Recommended she try Reglan now as chance of increasing her milk supply is best within first 6 weeks.  Mom will think about it.   Encouraged Mom to research Dr. Yevette Edwards website for more information and tips on increasing milk supply.  Mom knows she can call prn for concerns.  Interventions Interventions: Breast feeding basics reviewed;DEBP;Coconut oil  Lactation Tools Discussed/Used Tools: Pump;Coconut oil Breast pump type: Double-Electric Breast Pump   Consult Status Consult Status: Follow-up Date: 07/28/19 Follow-up type: In-patient    [name] 07/21/2019, 10:23 AM

## 2019-07-28 ENCOUNTER — Ambulatory Visit: Payer: Self-pay

## 2019-07-28 NOTE — Lactation Note (Signed)
This note was copied from a baby's chart. Lactation Consultation Note  Patient Name: Jessica Pierce Today's Date: 07/28/2019   Mom is taking Reglan for milk supply.  In one week, her supply went from 120 ml total to 12 oz total per day, an increase of 4 times!  Baby had bath right before assist.  Appeared tired.  Mom pre-pumped about an hour ago, and just now for a few minutes. Milk easily expressed by hand.  Baby positioned in football hold on left side.  Baby opened his mouth and latched loosely to nipple.  He just sat there.  After he slid off, tried again, showing Mom how to support baby's head and wait for him to open his mouth wider.  Nipple touching top lip, milk expressed onto nipple, and baby opened a little wider and was able to attain a deeper latch to breast past the nipple.  Lower lip uncurled by tugging gently on chin.  Support behind Mom's hand to relax her hold of baby's head.  Baby initially sucked 2 times and eye brows elevated and he stopped.  Mom is relaxed, baby appears very comfortable with exception of some stiffening and slight arching periodically.   Reassured Mom that this was excellent for 33 weeks AGA.    Mom to allow baby to go to pumped breast prn with feeding cues.   Mom to pump after baby nuzzles, latches, sucks a couple times.   Milk supply has quadrupled since starting Reglan 6 days ago.   Mom would like a follow-up in 2 days.   [name] 07/28/2019, 1:19 PM

## 2019-07-30 ENCOUNTER — Ambulatory Visit: Payer: Self-pay

## 2019-07-30 NOTE — Lactation Note (Signed)
This note was copied from a baby's chart. Lactation Consultation Note  Patient Name: Jessica Pierce Today's Date: 07/30/2019 Reason for consult: Follow-up assessment;Mother's request;1st time breastfeeding;NICU baby;Primapara;Infant < 6lbs;Preterm <34wks Type of Endocrine Disorder?: PCOS  LC visited with Mom.  Baby sleeping STS on her chest.  Mom states she is still getting 4 X the amount of breastmilk she was getting when she pumps.  She is going to her MD on Friday 4/23 and will inquire about another Rx for Reglan.  Mom feels great, no symptoms of anxiety or depression noted.  Mom actually is feeling better as her milk supply has increased.  Mom concerned about baby being NG fed continuously and not being able to get hungry and show feeding cues.  She is anxious to start breastfeeding.    Baby is on 4L HFNC with frequent desats attributed to GERD.  Baby is <4lbs and 33 wks 2d AGA.    Reassured Mom that baby will have time to learn to breast feed as he matures and grows.  Encouraged Mom to continue lots of STS, and consistent pumping.    Will assist with positioning and latching baby to breast prn.  Consult Status Consult Status: Follow-up Date: 08/04/19 Follow-up type: In-patient    Jessica Pierce 07/30/2019, 2:31 PM

## 2019-08-18 ENCOUNTER — Ambulatory Visit: Payer: Self-pay

## 2019-08-18 NOTE — Lactation Note (Signed)
This note was copied from a baby's chart. Lactation Consultation Note  Patient Name: Jessica Pierce Today's Date: 08/18/2019 Reason for consult: Follow-up assessment;Mother's request;Other (Comment);NICU baby;Preterm <34wks(speech request)  1448 - [phone] - Speech suggested that lactation could follow up with Ms. Vanmaanen. Baby is now practicing lick and learn at the breast. I entered the room for the 1500 feeding. Ms. Pilson was holding her son at the breast (right breast cradle hold) upon entry. Baby was rooting. She states that he has been latching briefly, but she is having some difficulty with helping him establish the latch and maintain it.  I assisted her with sandwiching the breast. He latched to the breast with sandwich hold. Ms. Hochstein had difficulty with repeating this hold. Baby began to have an episode of bradycardia while in visit, and we discontinued the feeding. I offered to return tomorrow to show her some options for breast feeding positions. She agreed and asked me to return for the noon feeding on 5/11.   Feeding Feeding Type: Breast Milk  LATCH Score Latch: Repeated attempts needed to sustain latch, nipple held in mouth throughout feeding, stimulation needed to elicit sucking reflex.  Audible Swallowing: None  Type of Nipple: Everted at rest and after stimulation  Comfort (Breast/Nipple): Soft / non-tender  Hold (Positioning): Assistance needed to correctly position infant at breast and maintain latch.  LATCH Score: 6  Interventions Interventions: Breast feeding basics reviewed;Assisted with latch;Skin to skin;Hand express;Breast compression;Adjust position   Consult Status Consult Status: Follow-up Date: 08/19/19 Follow-up type: In-patient    Walker Shadow 08/18/2019, 3:59 PM

## 2019-08-19 ENCOUNTER — Ambulatory Visit: Payer: Self-pay

## 2019-08-19 NOTE — Lactation Note (Signed)
This note was copied from a baby's chart. Lactation Consultation Note  Patient Name: Jessica Pierce Today's Date: 08/19/2019 Reason for consult: Follow-up assessment;Mother's request;Primapara;1st time breastfeeding;NICU baby;Preterm <34wks   1200 - 1230 - LC reported for follow up today to help latch baby Caleb to the breast. We placed him in football hold on the left breast. I had [name] lean back in her recliner chair a bit to stabilize her position.  I showed her how to put support pillows high on the breast and to latch using by sandwiching the breast. She is aware of the "U" hold but she has difficulty seeing the angle and compressing the breast at the correct angle. I involved the FOB to help with sandwiching the breast and encouraged her to take advantage of a little assistance while baby is first learning how to latch.  Caleb latched to the berast with breast tissue movement noted and felt. He stayed on while we discussed breast feeding basics, and he became sleepy after several minutes. Eventually he released the breast and fell asleep.  [name] prefers to make an "airhole" due to concerns about baby having respiratory distress. I showed her how to do so without putting tension on the area where baby is latched.  [name] had questions related to informal milk sharing. She has some possible donors she is vetting. I related that I was unable to endorse this, but I did give her the [employer] in [place]'s contact information so she could call them and get some information.   I suggested that we follow up again for more latch assistance in the near future. She verbalized understanding.   Maternal Data Formula Feeding for Exclusion: No Has patient been taught Hand Expression?: Yes Does the patient have breastfeeding experience prior to this delivery?: No  Feeding Feeding Type: Breast Fed  LATCH Score Latch: Repeated attempts needed to sustain latch, nipple held in mouth  throughout feeding, stimulation needed to elicit sucking reflex.  Audible Swallowing: None  Type of Nipple: Everted at rest and after stimulation  Comfort (Breast/Nipple): Soft / non-tender  Hold (Positioning): Assistance needed to correctly position infant at breast and maintain latch.  LATCH Score: 6  Interventions Interventions: Breast feeding basics reviewed;Assisted with latch;Skin to skin;Hand express;Breast compression;Adjust position;Support pillows;Position options    Consult Status Consult Status: Follow-up Date: 08/28/19 Follow-up type: In-patient    Walker Shadow 08/19/2019, 12:42 PM

## 2019-08-20 ENCOUNTER — Ambulatory Visit: Payer: Self-pay

## 2019-08-20 NOTE — Lactation Note (Signed)
This note was copied from a baby's chart. Lactation Consultation Note   Patient Name: Boy Rica Mosher Today's Date: 08/20/2019 Reason for consult: Follow-up assessment;Other (Comment)(refferral by SLP)  Telephone call from RN reporting that mom is here.  Mom reports she had a beer and some caffeine earlier today maybe around 4pm and asked if it was okay to breastfeed.  Reviewed caffeine recommendations and alcohol recommendations with mom.   Infant fussy when mom picked him up.  Minimal assist with trying to get infant to latch. Infant rooted twice. When tried he turned down away from breast.  Tried both with and without nipple shield.  Infant rooted twice then quit ehxibiting any cues.Left him next to mom.And she put him on the other breast.   He just sat there.  No cues noted.  Mom reports sore nipples.  Discussed flange size.  Measured moms nipples for appropriate flange fit. Mom has been using size 16.  They are too small.  Switched her to size 21 flanges. Urged her to use something for lubrication and hand express before pumping and after pumping and rub expressed mothers milk on nipples and air dry.  Then use comfort gels.  Gave comfort gels and reviewed how to use them.  Mom reports she notices her nipples turning white a lot with pumping.  No hx of Raynauds.  Discussed gently massage past pumping on nipples and areola with warmed coconut oil.   Urged mom to call lactation as needed.   Maternal Data    Feeding Feeding Type: Breast Milk  LATCH Score                   Interventions    Lactation Tools Discussed/Used     Consult Status Consult Status: PRN Date: 08/21/19 Follow-up type: In-patient    [facility] Michaelle Copas 08/20/2019, 7:51 PM

## 2019-08-20 NOTE — Lactation Note (Addendum)
This note was copied from a baby's chart. Lactation Consultation Note  Patient Name: Jessica Pierce Today's Date: 08/20/2019 Reason for consult: Follow-up assessment   LC Follow Up Visit:  SLP informed me that this mother will be attempting to breast feed at the 1200 feeding.  I had two visits scheduled before this room and did not arrive until 1230.  Baby was asleep in mother's arms when I arrived.  She informed me that he was not interested in feeding at 1200.    Provided much emotional support as parents verbalized feelings related to their NICU stay.  They are very tired of spending time in the NICU and mentioned that it has now been "70 days" that they have been here.  Mother informed me that she has only gone home four times and father goes between work, home and the NICU.  They are exhausted and do not feel like there are enough nurses they can trust to care for their baby on a regular basis.  They have a select few whom they appreciate to care for their son.  Father mentioned that there have been seven different nurses caring for their baby in the last nine days.  They relayed two incidences the last two nights that made them not even want to leave for dinner.  I acknowledged their feelings, listened and asked if there was anything I could do or anyone I could speak to on their behalf.  Mother stated she has been in contact with the assistant director many times and the director as well.  Parents are still not satisfied.  They did not request that I do anything or speak to anyone for them, however, I informed them to let me know if they change their minds.  Parents verbalized understanding.  Mother did ask about the possiiblitiy of using a NS when baby breast feeds.  She has heard differing opinions on whether or not this would be beneficial.  I explained that a NS could possibly benefit baby and that she is willing to call for assistance as desired.  I suggested mother wait for a feeding  when baby appears to be awake and alert to maximize his experience with a NS.  Reminded parents of baby's gestational age and that he is not expected to be "breast feeding" at 36+2 weeks.  Reinforced STS and attempting when baby is ready.  RN updated.       Consult Status Consult Status: PRN Date: 08/20/19 Follow-up type: Call as needed    Jessica Pierce 08/20/2019, 1:05 PM

## 2019-08-20 NOTE — Lactation Note (Signed)
This note was copied from a baby's chart. Lactation Consultation Note  Patient Name: [name] Today's Date: 08/20/2019  Attempt to see mom.  Mom not here.  RN reports she isn't sure where mom  is.  RN to let mom know we came and RN to call if she wants to be seen.  Maternal Data    Feeding Feeding Type: Breast Milk  LATCH Score                   Interventions    Lactation Tools Discussed/Used     Consult Status      Lasonja Lakins Michaelle Copas 08/20/2019, 5:43 PM

## 2019-08-26 ENCOUNTER — Ambulatory Visit: Payer: Self-pay

## 2019-08-26 NOTE — Lactation Note (Signed)
This note was copied from a baby's chart. Lactation Consultation Note:   Patient Name: Jessica Pierce Today's Date: 08/26/2019  Staff Nurse Vernona Rieger phoned [facility] and ask if mother could have more comfort gels. She reports that mother has been using comfort gels for 12 days.  Staff nurse got more comfort gels off LC cart and advised her to tell mother that she should only use for 6 days.  Staff nurse reports that when infant is breastfeeding that infant only gets on the nipple..  LC to stop by and see mother . But unable to see mother now due to another consult.      Maternal Data    Feeding Feeding Type: Breast Milk  LATCH Score                   Interventions    Lactation Tools Discussed/Used     Consult Status      Jessica Pierce 08/26/2019, 3:45 PM

## 2019-08-26 NOTE — Lactation Note (Signed)
This note was copied from a baby's chart. Lactation Consultation Note:   Patient Name: Jessica Pierce   Today's Date: 08/26/2019  LC stopped by Mothers room and she was at lunch. Staff nurse reports that she told mother to only use comfort gels for 6 days. Staff nurse reports that ST will assist mother with bottle feeding the next feed and that she will assist mother with breastfeeding.  She reports that mother doesn't like to use the nipple shield and she is getting sore.      Maternal Data    Feeding Feeding Type: Breast Milk  LATCH Score                   Interventions    Lactation Tools Discussed/Used     Consult Status      [name] 08/26/2019, 3:52 PM

## 2019-08-27 ENCOUNTER — Ambulatory Visit: Payer: Self-pay

## 2019-08-27 NOTE — Lactation Note (Signed)
This note was copied from a baby's chart. Lactation Consultation Note  Patient Name: Jessica Pierce Today's Date: 08/27/2019 Reason for consult: Follow-up assessment;Primapara;1st time breastfeeding;NICU baby;Early term 37-38.6wks;Nipple pain/trauma Type of Endocrine Disorder?: Diabetes  LC in to assist with [name] of infant in the NICU.  Baby 2 months old and being gavage fed along with breast feeding.  Baby has been given bottles and is having desaturations of O2 with bottles.  Mom has a low milk supply.  Obtaining 30 ml total from both breasts.  Mom's pumping frequency has also dropped as she isn't pumping as much at night.    When entered room, Mom had baby in cradle hold on the breast.  Baby had been feeding for about 10 mins.  Mom not supporting her breast, nor baby on a pillow.  Baby laying across Mom's abdomen.   Occasional swallow identified, but baby not actively sucking/swallowing.  Talked to Mom about trying to supplement at the breast to increase baby's nutritive sucking.  This could help increase her milk supply, and possibly enable baby to obtain feeding at the breast, where she had been tolerating better.  Initiated 20 ml supplement in SNS at the breast, while Mom holding baby in cradle hold.  Tried to encourage Mom to change over to cross cradle hold, but Mom stated "oh that doesn't work".  Mom using scissor hold to latch baby.  Tried to help Mom understand to use 4 finger under breast hold, but Mom doing what she has found to work.  With SNS clipped on Mom's bra strap, adjusting tube depth along nipple, baby started drawing supplement from SNS.  Deep jaw extensions noted with swallows noted 2:1 ration.  Mom feeling a good tug.  Did need to pull on baby's chin to uncurl lower lip.  Mom repeatedly pulling breast away from baby's mouth to look.  Chin in closer than nose.  After 10 ml ingested, baby had a desaturation.  Lowered height of volufeed to slow flow rate, did not help baby  recover.  Took baby off breast and burped him.  It took about 60 sec for O2 level to recover, heart rate remained WNL.    Mom asked about trying with the nipple shield.  Initiated a 20 mm nipple shield, nipple pulled well into shield.  Instilled 5 fr feeding tube (as it is stiffer and easier to thread into baby's mouth than the tube with the SNS)  Baby latched fairly well, not as deep as he could be.  Tried multiple time to readjust his mouth and encourage Mom to support her breast firmly.  With the increased flow of supplement by the feeding tube and syringe, but became more vigorous.  He took 10 ml from syringe and then started becoming tired and milk noted around his mouth.    Mom took baby off and placed him on her chest for a burp.  About 10 mins after feeding over, he became fussy again.  Baby was on breast a total of about 25 mins (on breast without ns, on breast with SNS, and on breast with ns and 5 fr feeding tube).    LC spoke with NNP about baby getting full feeding supplement after being on the breast.  Mom's milk supply is low, and baby's isn't latching and transferring well at the breast.    LC to follow up tomorrow with another Starter SNS or full SNS if Mom is wanting to use when breastfeeding.  Feeding Feeding Type: Breast Fed  LATCH Score Latch: Repeated attempts needed to sustain latch, nipple held in mouth throughout feeding, stimulation needed to elicit sucking reflex.  Audible Swallowing: Spontaneous and intermittent(with SNS flowing at the breast)  Type of Nipple: Everted at rest and after stimulation  Comfort (Breast/Nipple): Filling, red/small blisters or bruises, mild/mod discomfort(Mom using Comfort Gels on breasts due to sore nipples)  Hold (Positioning): Assistance needed to correctly position infant at breast and maintain latch.  LATCH Score: 7  Interventions Interventions: Breast feeding basics reviewed;Assisted with latch;Skin to skin;Breast massage;Hand  express;Breast compression;Adjust position;Support pillows;Position options;Expressed milk;Comfort gels;DEBP  Lactation Tools Discussed/Used Tools: Pump;Supplemental Nutrition System;Nipple Shields;32F feeding tube / Syringe Nipple shield size: 20 Breast pump type: Double-Electric Breast Pump   Consult Status Consult Status: Follow-up Date: 08/28/19 Follow-up type: [place] 08/27/2019, 4:29 PM

## 2019-08-27 NOTE — Lactation Note (Signed)
This note was copied from a baby's chart. Lactation Consultation Note  Patient Name: Jessica Pierce Today's Date: 08/27/2019  P1, 2 month Premature female infant. LC entered room, per mom she stated she needed more comfort gels, she last received comfort gels on 08/20/19 and wanted new pack. LC did not assess mom's breast due to mom wanting to sleep at this time. Per mom, she has been using NS but has been pumping, she is not using  the size 21 flange as recommended by previous LC but using size 17 inserts that she ordered from [employer]. LC is unsure if Mom reported soreness is coming from the NS or  The breast flange mom is using when pumping. LC was not able to do a breast assessment , mom did not want it prefers,  LC services to come back and visit her tomorow.     Maternal Data    Feeding Feeding Type: Breast Milk with Formula added  LATCH Score                   Interventions    Lactation Tools Discussed/Used     Consult Status      Danelle Earthly 08/27/2019, 3:33 AM

## 2019-08-28 ENCOUNTER — Ambulatory Visit: Payer: Self-pay

## 2019-08-28 NOTE — Lactation Note (Signed)
This note was copied from a baby's chart. Lactation Consultation Note  Patient Name: Jessica Pierce Today's Date: 08/28/2019 Reason for consult: Follow-up assessment;Mother's request;Difficult latch;Primapara;1st time breastfeeding;NICU baby;Preterm <34wks  1200 - 1230 - I followed up with Ms. Szczesniak at Caleb's noon feeding. Baby latched briefly to right side in football without use of a nipple shield. Mom needs help with sandwiching the breast on this side. Her hold is awkward, and she can't "see" where to hold breast to latch. She wants to do a "C" hold where a "U" hold would work better.We used a mirror so she could better see. She commented that she felt physically limited when we try to rotate her hand.  Baby latched for several minutes with intermittent, sleepy suckles. We discontinued the feeding because he was grunting, bearing down, and turning red. Ms. Bauder stated to the RN that she will likely reinstate prune juice to help baby stool. Baby not ready at this feed for feeding tube device. Ms. Steinmeyer interested in having LC to check in again tomorrow to see if it's a better time for noon, 1500 or 1800 feed. Towards the end of visit, baby desatted again. Followed up with SLP, Irving Burton, after the visit.    Maternal Data Has patient been taught Hand Expression?: Yes Does the patient have breastfeeding experience prior to this delivery?: No  Feeding Feeding Type: Breast Milk with Formula added  LATCH Score Latch: Repeated attempts needed to sustain latch, nipple held in mouth throughout feeding, stimulation needed to elicit sucking reflex.  Audible Swallowing: None  Type of Nipple: Everted at rest and after stimulation  Comfort (Breast/Nipple): Soft / non-tender  Hold (Positioning): Assistance needed to correctly position infant at breast and maintain latch.  LATCH Score: 6  Interventions Interventions: Breast feeding basics reviewed;Assisted with latch;Hand express;Breast  compression  Lactation Tools Discussed/Used     Consult Status Consult Status: Follow-up Date: 08/29/19 Follow-up type: In-patient    Walker Shadow 08/28/2019, 2:32 PM

## 2019-08-29 ENCOUNTER — Ambulatory Visit: Payer: Self-pay

## 2019-08-29 NOTE — Lactation Note (Signed)
This note was copied from a baby's chart. Lactation Consultation Note  Patient Name: Boy Kayin Ureta Today's Date: 08/29/2019 Reason for consult: Follow-up assessment;1st time breastfeeding;NICU baby;Primapara;Early term 37-38.6wks Type of Endocrine Disorder?: PCOS     LC in to assist with positioning and latching baby at the breast.   Mom sitting in recliner.  Offered to assist with SNS at the breast with full feeding.  Mom agreeable to try.  Baby grunting and stiffening throughout the feeding.  Mom insists on using cradle hold, and scissor hold on her nipples when trying to latch baby.  Baby lying on Mom's abdomen with his head turned to the breast and body facing the ceiling.  Reminded her to turn baby towards her.  Gently tried to explain how she would need to support her breasts and hold baby's head to control his latch, showing Mom how to use the cross cradle or football holds.  Mom won't do this.    Tried with and without the nipple shield.  Baby would latch with a wide gape of his mouth and then slide onto nipple repeatedly.  Initiated a 20 mm nipple shield, but without firm breast support, baby would latch and slide on and off the breast.  Without nipple shield, baby able to attain a deeper latch, but lips are tucked in.  Attempted to tug on chin and flange lower lip, but he would pull it back up again.  Mom getting frustrated.   Tried multiple times with SNS at the breast, but baby unable to flange his lips and consistently create enough suction to draw the supplement at the breast.    Mom wanting to use 5 fr feeding tube and syringe and thread it into corner of his mouth, but she is aware she can't do this long term.   The goal would be to have baby control the flow and the SNS is designed for this.    Mom also shared with LC that she last pumped last night.  She is on the verge of quitting, but knows that [place] loves the breast.  Mom has another prescription for Reglan, but hasn't  taken it.  Mom states she isn't taking any of her medications.  She started to cry and LC gave Mom some TLC and empathy.  Lactation to follow-up over the weekend.    Consult Status Consult Status: Follow-up Date: 09/01/19 Follow-up type: In-patient    [name] 08/29/2019, 4:21 PM

## 2019-09-02 ENCOUNTER — Ambulatory Visit: Payer: Self-pay

## 2019-09-02 NOTE — Lactation Note (Signed)
This note was copied from a baby's chart. Lactation Consultation Note  Patient Name: Jessica Pierce Today's Date: 09/02/2019 Reason for consult: Follow-up assessment;Primapara;1st time breastfeeding;NICU baby;Early term 37-38.6wks Type of Endocrine Disorder?: PCOS  LC in to visit with [name] of AGA [redacted]w[redacted]d infant in the NICU.  Baby 2 months old.  Mom is feeling much better today, she slept all night.  Decided not to restart Reglan Rx for her milk supply due to possible depressive affects to her.  Mom had taken 2 rounds of 10 days each.  Supported [employer].  Mom is coming to terms with breastfeeding being an appetizer or dessert, but not baby's full feed.  Mom slept all night and pumped twice this am.  Mom expressing about 30 ml per pumping.  Praised Mom for her dedication to pumping.    Baby resting in Mom's arms currently.  Baby with hiccups and some desats noted.    Baby started on Dr. Theora Gianotti ultra premie bottle yesterday and baby took 10 ml this am and did really well.  Mom is excited for baby to advance in volume, but understands need to go slowly.  Mom had questions about latching baby for comfort.  Mom agrees to not count any breastfeeding in algorithm due to his latch and Mom's milk supply.  Mom knows LC available prn. .   Interventions Interventions: Breast feeding basics reviewed;Skin to skin;Breast massage;Hand express;DEBP  Lactation Tools Discussed/Used Tools: Pump;Bottle;Nipple Shields Nipple shield size: 20 Breast pump type: Double-Electric Breast Pump   Consult Status Consult Status: Follow-up Date: 09/05/19 Follow-up type: In-patient    Jessica Pierce 09/02/2019, 10:01 AM

## 2019-09-13 ENCOUNTER — Ambulatory Visit: Payer: Self-pay

## 2019-09-13 NOTE — Lactation Note (Addendum)
This note was copied from a baby's chart. Lactation Consultation Note  Patient Name: Jessica Pierce Today's Date: 09/13/2019 Reason for consult: Follow-up assessment;Primapara;1st time breastfeeding;NICU baby  1230 - I followed up with Ms. Dishon today when rounding, and she notified me that Wilber Oliphant would be discharged today. She is overjoyed and nervous, per Ms. Muska.  I spoke to her for a few minutes about her journey here and offered my congratulations. I provided her with another copy of our breast feeding brochure and contact information as we were unsure if she still had one, and I reviewed our support resources. I also provided a pack of comfort gels upon request.  I encouraged her to continue to let Caleb enjoy the breast and offer him little snacks there. She states that he breast fed earlier today, and he did well with it. I encouraged her to please call lactation to let us know how she's doing in the future.   Feeding Feeding Type: Breast Milk with Formula added Nipple Type: Dr. Levert Feinstein Preemie   Consult Status Consult Status: Complete Date: 09/13/19    Walker Shadow 09/13/2019, 12:56 PM

## 2020-04-10 HISTORY — PX: OTHER SURGICAL HISTORY: SHX169

## 2021-04-20 ENCOUNTER — Ambulatory Visit: Payer: Self-pay | Admitting: Surgery

## 2021-04-20 NOTE — H&P (Signed)
Subjective   Chief Complaint: Chronic wound on breast from biopsy not healing      History of Present Illness: Jessica Pierce is a 36 y.o. female who is seen today as an office consultation at the request of Dr. Pasty Arch for evaluation of Chronic wound on breast from biopsy not healing  .    Jessica Pierce is a 36 y.o. female who is presenting per request of Claudina Lick, MD, at [place], for evaluation of a nonhealing wound status post right breast biopsy.  She had a breast ultrasound at [facility] on 11/24/2020 which showed a 1.9 x 1.4 x 1.5 cm partially solid mass in the right upper inner breast, middle depth at 2:00.  Ultrasound-guided biopsy was performed on 12/02/2020.  Pathology showed fat necrosis without malignancy.  The area had some issues healing until taking course of Bactrim in early October.  Afterward, it got better and was almost completely healed.   In December 2022, it became "puffy" over the site and she popped it with a safety pin that she cleaned with alcohol.  She said some blood and pus evacuated.  She started Bactrim prior to her last visit on 04/06/21.  The redness decreased but then started again yesterday.  Dr. Truman Hayward wanted [place] to evaluate the area further.    Of note, she had her gallbladder taken out about a year ago and had some wound healing issues/reactions to suture material.   Review of Systems: A complete review of systems was obtained from the patient.  I have reviewed this information and discussed as appropriate with the patient.  See HPI as well for other ROS.  Review of Systems  Constitutional: Negative.   HENT: Negative.   Eyes: Negative.   Respiratory: Negative.   Cardiovascular: Negative.   Gastrointestinal: Negative.   Genitourinary: Negative.   Musculoskeletal: Negative.   Skin: Negative.   Neurological: Negative.   Endo/Heme/Allergies: Negative.   Psychiatric/Behavioral: Negative.       Medical History: History reviewed. No pertinent past  medical history.  Patient Active Problem List  Diagnosis   Female hirsutism   Calculus of gallbladder with chronic cholecystitis without obstruction   Migraine aura, persistent, intractable, with status migrainosus   Morbid (severe) obesity due to excess calories (CMS-HCC)   PCOS (polycystic ovarian syndrome)    Past Surgical History:  Procedure Laterality Date   LAPAROSCOPIC CHOLECYSTECTOMY       Allergies  Allergen Reactions   Amoxicillin-Pot Clavulanate Itching   Methylphenidate Anxiety    Current Outpatient Medications on File Prior to Visit  Medication Sig Dispense Refill   metFORMIN (GLUCOPHAGE) 1000 MG tablet      pioglitazone (ACTOS) 15 MG tablet Take 15 mg by mouth once daily     sulfamethoxazole-trimethoprim (SEPTRA) 200-40 mg/5 mL suspension Take by mouth every 12 (twelve) hours     No current facility-administered medications on file prior to visit.    Family History  Problem Relation Age of Onset   Breast cancer Maternal Aunt    Diabetes Maternal Grandmother    Coronary Artery Disease (Blocked arteries around heart) Maternal Grandfather      Social History   Tobacco Use  Smoking Status Former   Types: Cigarettes   Quit date: 2010   Years since quitting: 13.0  Smokeless Tobacco Never     Social History   Socioeconomic History   Marital status: Single  Tobacco Use   Smoking status: Former    Types: Cigarettes    Quit  date: 2010    Years since quitting: 13.0   Smokeless tobacco: Never  Substance and Sexual Activity   Alcohol use: Yes   Drug use: Never    Objective:    Vitals:   04/20/21 1435  Pulse: 108  Temp: 36.8 C (98.3 F)  SpO2: 97%  Weight: (!) 160.1 kg (353 lb)  Height: 171.5 cm (5' 7.5")    Body mass index is 54.47 kg/m.  Physical Exam   Constitutional:  WDWN in NAD, conversant, no obvious deformities; lying in bed comfortably Eyes:  Pupils equal, round; sclera anicteric; moist conjunctiva; no lid lag HENT:  Oral  mucosa moist; good dentition  Neck:  No masses palpated, trachea midline; no thyromegaly Lungs:  CTA bilaterally; normal respiratory effort Breasts:  symmetric, no nipple changes;right breast - medial to areola - 1 cm diameter dimpled scar with some underlying firmness.  Currently no drainage or cellulitis. CV:  Regular rate and rhythm; no murmurs; extremities well-perfused with no edema Abd:  +bowel sounds, soft, non-tender, no palpable organomegaly; no palpable hernias Musc:  Unable to assess gait; no apparent clubbing or cyanosis in extremities Lymphatic:  No palpable cervical or axillary lymphadenopathy Skin:  Warm, dry; no sign of jaundice Psychiatric - alert and oriented x 4; calm mood and affect   Labs, Imaging and Diagnostic Testing: Mammogram 11/24/2020 showing a 0.9 x 1.5 x 1.7 cm mass in the medial right breast.  Ultrasound that same day showed a 1.9 x 1.4 x 1.5 cm partially solid mass 6 cm from the nipple.  12/08/2020, ultrasound-guided biopsy of the 2:00 breast mass in the right breast located 6 cm from the nipple.  Pathology revealed fat necrosis with no sign of malignancy.  Assessment and Plan:  Diagnoses and all orders for this visit:  Chronic abscess of breast     Currently, the wound does not appear to be actively infected although previously it had healed to this point.  I am concerned about a smoldering subacute infection and the firm area just beneath the skin.  This is causing some dimpling of the skin.  Recommend excision of this area.  The patient inquired about excision of the biopsy clip.  I explained to her that this was not the primary goal of the surgery since she did not have a diagnosis of any malignancy.  Her goal is to excise the chronic scar tissue to allow the wound to heal completely.  We will image the specimen to see if the biopsy clip is within the specimen at the time of lumpectomy.  She is satisfied with this explanation.  No follow-ups on  file.  Carlean Jews, MD  04/20/2021 4:03 PM

## 2021-05-03 ENCOUNTER — Ambulatory Visit: Admit: 2021-05-03 | Payer: Medicaid Other | Admitting: Surgery

## 2021-05-03 SURGERY — BREAST LUMPECTOMY
Anesthesia: General | Site: Breast | Laterality: Right

## 2021-11-10 ENCOUNTER — Ambulatory Visit: Payer: Self-pay | Admitting: Surgery

## 2021-11-10 NOTE — H&P (Signed)
Subjective   Chief Complaint: Abscess (Recurrent right breast abscess/)     History of Present Illness: Jessica Pierce is a 36 y.o. female who is seen today as an office consultation at the request of Dr. Ginette Otto for evaluation of Abscess (Recurrent right breast abscess/) .    Jessica Pierce is a 36 y.o. female who is presenting per request of Mirian Capuchin, MD, at [facility], for evaluation of a nonhealing wound status post right breast biopsy.  She had a breast ultrasound at [facility] on 11/24/2020 which showed a 1.9 x 1.4 x 1.5 cm partially solid mass in the right upper inner breast, middle depth at 2:00.  Ultrasound-guided biopsy was performed on 12/02/2020.  Pathology showed fat necrosis without malignancy.  The area had some issues healing until taking course of Bactrim in early October.  Afterward, it got better and was almost completely healed.   In December 2022, it became "puffy" over the site and she popped it with a safety pin that she cleaned with alcohol.  She said some blood and pus evacuated.  She started Bactrim prior to her last visit on 04/06/21.  The redness decreased but then started again yesterday.  Dr. Nedra Hai wanted [place] to evaluate the area further.    Of note, she had her gallbladder taken out about a year ago and had some wound healing issues/reactions to suture material.  She was originally seen in January 2023 and was scheduled for surgery.  However the medial right breast chronic abscess improved as she canceled her surgery.  She has had intermittent recurrences in the medial right breast over the last few months.  However approximately 1 month ago, she developed a new abscess laterally at 9:00.  This was imaged and aspirated by radiology.  Imaging showed a 1.4 x 1.4 x 1.0 cm fluid collection.  This area was opened up last week.  The drainage has decreased.  It is still fairly tender.  She returns today to discuss excision of both of these areas.   Review of Systems: A  complete review of systems was obtained from the patient.  I have reviewed this information and discussed as appropriate with the patient.  See HPI as well for other ROS.  Review of Systems  Constitutional: Negative.   HENT: Negative.    Eyes: Negative.   Respiratory: Negative.    Cardiovascular: Negative.   Gastrointestinal: Negative.   Genitourinary: Negative.   Musculoskeletal: Negative.   Skin: Negative.   Neurological: Negative.   Endo/Heme/Allergies: Negative.   Psychiatric/Behavioral: Negative.       Medical History:   Patient Active Problem List  Diagnosis   Female hirsutism   Calculus of gallbladder with chronic cholecystitis without obstruction   Migraine aura, persistent, intractable, with status migrainosus   Morbid (severe) obesity due to excess calories (CMS-HCC)   PCOS (polycystic ovarian syndrome)    Past Surgical History:  Procedure Laterality Date   LAPAROSCOPIC CHOLECYSTECTOMY       Allergies  Allergen Reactions   Amoxicillin-Pot Clavulanate Itching   Methylphenidate Anxiety    Current Outpatient Medications on File Prior to Visit  Medication Sig Dispense Refill   buPROPion (WELLBUTRIN XL) 150 MG XL tablet Take 1 tablet by mouth once daily     busPIRone (BUSPAR) 5 MG tablet Take 2.5 mg by mouth at bedtime     metFORMIN (GLUCOPHAGE-XR) 500 MG XR tablet Take by mouth     metFORMIN (GLUCOPHAGE) 1000 MG tablet  (Patient not taking:  Reported on 11/10/2021)     pioglitazone (ACTOS) 15 MG tablet Take 15 mg by mouth once daily (Patient not taking: Reported on 11/10/2021)     sulfamethoxazole-trimethoprim (SEPTRA) 200-40 mg/5 mL suspension Take by mouth every 12 (twelve) hours (Patient not taking: Reported on 11/10/2021)     No current facility-administered medications on file prior to visit.    Family History  Problem Relation Age of Onset   Breast cancer Maternal Aunt    Diabetes Maternal Grandmother    Coronary Artery Disease (Blocked arteries around  heart) Maternal Grandfather      Social History   Tobacco Use  Smoking Status Former   Types: Cigarettes   Quit date: 2010   Years since quitting: 13.5  Smokeless Tobacco Never     Social History   Socioeconomic History   Marital status: Single  Tobacco Use   Smoking status: Former    Types: Cigarettes    Quit date: 2010    Years since quitting: 13.5   Smokeless tobacco: Never  Substance and Sexual Activity   Alcohol use: Yes   Drug use: Never    Objective:    BMI 54.47  Physical Exam   Constitutional:  WDWN in NAD, conversant, no obvious deformities; lying in bed comfortably Eyes:  Pupils equal, round; sclera anicteric; moist conjunctiva; no lid lag HENT:  Oral mucosa moist; good dentition  Neck:  No masses palpated, trachea midline; no thyromegaly Lungs:  CTA bilaterally; normal respiratory effort Breasts:  symmetric, no nipple changes;right breast - medial to areola - 1 cm diameter dimpled scar with some underlying firmness.  Currently no drainage or cellulitis. Lateral to the right nipple areolar complex, there is a 1.5 cm opening with some exposed granulation tissue.  The abscess currently is about 1 cm deep.  There is minimal surrounding erythema and induration.  This is fairly tender. CV:  Regular rate and rhythm; no murmurs; extremities well-perfused with no edema Abd:  +bowel sounds, soft, non-tender, no palpable organomegaly; no palpable hernias Musc: Normal gait; no apparent clubbing or cyanosis in extremities Lymphatic:  No palpable cervical or axillary lymphadenopathy Skin:  Warm, dry; no sign of jaundice Psychiatric - alert and oriented x 4; calm mood and affect   Labs, Imaging and Diagnostic Testing: Mammogram 11/24/2020 showing a 0.9 x 1.5 x 1.7 cm mass in the medial right breast.  Ultrasound that same day showed a 1.9 x 1.4 x 1.5 cm partially solid mass 6 cm from the nipple.  12/08/2020, ultrasound-guided biopsy of the 2:00 breast mass in the  right breast located 6 cm from the nipple.  Pathology revealed fat necrosis with no sign of malignancy.  Assessment and Plan:  Diagnoses and all orders for this visit:  Chronic abscess of breast  S/P breast biopsy, right     The chronic nonhealing biopsy site in the right medial breast should be completely excised to allow to heal properly.  The newer right lateral breast abscess also needs to be excised, likely over a Penrose drain.  We will try to schedule both of these as soon as possible.  Luisa Louk Delbert Harness, MD  11/10/2021 10:58 AM

## 2022-04-10 HISTORY — PX: LAPAROSCOPIC GASTRIC SLEEVE RESECTION: SHX5895

## 2022-04-13 ENCOUNTER — Other Ambulatory Visit: Payer: Self-pay | Admitting: Orthopedic Surgery

## 2022-05-05 ENCOUNTER — Ambulatory Visit: Admit: 2022-05-05 | Payer: Medicaid Other | Admitting: Orthopedic Surgery

## 2022-05-05 SURGERY — CARPAL TUNNEL RELEASE
Anesthesia: LOCAL | Site: Hand | Laterality: Left

## 2023-04-11 HISTORY — PX: CARPAL TUNNEL RELEASE: SHX101

## 2024-03-12 ENCOUNTER — Other Ambulatory Visit (HOSPITAL_COMMUNITY)
Admission: RE | Admit: 2024-03-12 | Discharge: 2024-03-12 | Disposition: A | Source: Ambulatory Visit | Attending: Family Medicine | Admitting: Family Medicine

## 2024-03-12 ENCOUNTER — Ambulatory Visit

## 2024-03-12 VITALS — BP 129/63 | HR 75 | Ht 67.0 in | Wt 208.0 lb

## 2024-03-12 DIAGNOSIS — O0990 Supervision of high risk pregnancy, unspecified, unspecified trimester: Secondary | ICD-10-CM | POA: Insufficient documentation

## 2024-03-12 DIAGNOSIS — N912 Amenorrhea, unspecified: Secondary | ICD-10-CM

## 2024-03-12 DIAGNOSIS — Z3201 Encounter for pregnancy test, result positive: Secondary | ICD-10-CM

## 2024-03-12 DIAGNOSIS — Z3A01 Less than 8 weeks gestation of pregnancy: Secondary | ICD-10-CM

## 2024-03-12 LAB — POCT URINE PREGNANCY: Preg Test, Ur: POSITIVE — AB

## 2024-03-12 NOTE — Progress Notes (Signed)
 Patient here for UPT.  UPT in office was positive.  LMP 01/26/24.  Urine culture and GC/Chlamydia swab obtained.  Patient scheduled for phone intake and labs.  Erminio DELENA Rumps, RN

## 2024-03-14 LAB — CERVICOVAGINAL ANCILLARY ONLY
Chlamydia: NEGATIVE
Comment: NEGATIVE
Comment: NORMAL
Neisseria Gonorrhea: NEGATIVE

## 2024-03-14 LAB — URINE CULTURE, OB REFLEX: Organism ID, Bacteria: NO GROWTH

## 2024-03-14 LAB — CULTURE, OB URINE

## 2024-03-17 ENCOUNTER — Ambulatory Visit: Payer: Self-pay | Admitting: Family Medicine

## 2024-03-24 ENCOUNTER — Other Ambulatory Visit: Payer: Self-pay

## 2024-03-24 DIAGNOSIS — O219 Vomiting of pregnancy, unspecified: Secondary | ICD-10-CM

## 2024-03-24 MED ORDER — PROMETHAZINE HCL 25 MG PO TABS: 25.0000 mg | ORAL_TABLET | Freq: Four times a day (QID) | ORAL | 2 refills | Status: AC | PRN

## 2024-03-28 ENCOUNTER — Ambulatory Visit

## 2024-03-28 ENCOUNTER — Other Ambulatory Visit: Payer: Self-pay

## 2024-03-28 DIAGNOSIS — O0991 Supervision of high risk pregnancy, unspecified, first trimester: Secondary | ICD-10-CM | POA: Diagnosis not present

## 2024-03-28 DIAGNOSIS — Z3A08 8 weeks gestation of pregnancy: Secondary | ICD-10-CM | POA: Diagnosis not present

## 2024-03-28 DIAGNOSIS — O0990 Supervision of high risk pregnancy, unspecified, unspecified trimester: Secondary | ICD-10-CM | POA: Insufficient documentation

## 2024-03-28 NOTE — Progress Notes (Signed)
 New OB Intake  I explained I am completing New OB Intake today. We discussed EDD of 11/01/2024, by Last Menstrual Period. Pt is [id number]. I reviewed her allergies, medications and Medical/Surgical/OB history.    Patient Active Problem List   Diagnosis Date Noted   Supervision of high risk pregnancy, antepartum 03/28/2024   Postpartum care following vaginal delivery 06/14/2019   Preterm labor 06/11/2019   Migraine aura, persistent, intractable, with status migrainosus 08/11/2014   Severe obesity (BMI >= 40) (HCC) 08/11/2014    Concerns addressed today  Patient informed that the ultrasound is considered a limited obstetric ultrasound and is not intended to be a complete ultrasound exam.  Patient also informed that the ultrasound is not being completed with the intent of assessing for fetal or placental anomalies or any pelvic abnormalities. Explained that the purpose of today's ultrasound is to assess for viability.  Patient acknowledges the purpose of the exam and the limitations of the study.     Delivery Plans Plans to deliver at [facility]. Discussed the nature of our practice with multiple providers including residents and students. Due to the size of the practice, the delivering provider may not be the same as those providing prenatal care.   MyChart/Babyscripts MyChart access verified. I explained pt will have some visits in office and some virtually. Babyscripts app discussed and ordered.   Blood Pressure Cuff Blood pressure cuff discussed.  Discussed to be used for virtual visits and or if needed BP checks weekly.  Anatomy US  Explained first scheduled US  will be around 19 weeks.   Last Pap No results found for: DIAGPAP  First visit review I reviewed new OB appt with patient. Explained pt will be seen by TBD at first visit. Discussed [place] genetic screening with patient. Routine prenatal labs scheduled.    Erminio DELENA Rumps, [place] 03/28/2024  10:07 AM

## 2024-03-31 ENCOUNTER — Other Ambulatory Visit

## 2024-03-31 DIAGNOSIS — Z3A09 9 weeks gestation of pregnancy: Secondary | ICD-10-CM

## 2024-03-31 DIAGNOSIS — O0990 Supervision of high risk pregnancy, unspecified, unspecified trimester: Secondary | ICD-10-CM

## 2024-04-01 ENCOUNTER — Encounter: Payer: Self-pay | Admitting: Family Medicine

## 2024-04-01 LAB — CBC/D/PLT+RPR+RH+ABO+RUBIGG...
Antibody Screen: NEGATIVE
Basophils Absolute: 0 x10E3/uL (ref 0.0–0.2)
Basos: 0 %
EOS (ABSOLUTE): 0.1 x10E3/uL (ref 0.0–0.4)
Eos: 2 %
HCV Ab: NONREACTIVE
HIV Screen 4th Generation wRfx: NONREACTIVE
Hematocrit: 41 % (ref 34.0–46.6)
Hemoglobin: 13.8 g/dL (ref 11.1–15.9)
Hepatitis B Surface Ag: NEGATIVE
Immature Grans (Abs): 0 x10E3/uL (ref 0.0–0.1)
Immature Granulocytes: 0 %
Lymphocytes Absolute: 1.5 x10E3/uL (ref 0.7–3.1)
Lymphs: 21 %
MCH: 29.6 pg (ref 26.6–33.0)
MCHC: 33.7 g/dL (ref 31.5–35.7)
MCV: 88 fL (ref 79–97)
Monocytes Absolute: 0.5 x10E3/uL (ref 0.1–0.9)
Monocytes: 7 %
Neutrophils Absolute: 5.1 x10E3/uL (ref 1.4–7.0)
Neutrophils: 70 %
Platelets: 375 x10E3/uL (ref 150–450)
RBC: 4.67 x10E6/uL (ref 3.77–5.28)
RDW: 12.5 % (ref 11.7–15.4)
RPR Ser Ql: NONREACTIVE
Rh Factor: POSITIVE
Rubella Antibodies, IGG: 1.37 {index}
WBC: 7.3 x10E3/uL (ref 3.4–10.8)

## 2024-04-01 LAB — HCV INTERPRETATION

## 2024-04-01 NOTE — Progress Notes (Signed)
 Initial OB visit.    03/13/2024--[redacted]w[redacted]d EDD 11/06/2024 FHR 114 Has been using progesterone.    02/27/2024-HCG 552 02/29/2024-HCG 927 03/05/2024-HCG 5243.  04/01/2024--anteverted uterus with sac averaging 8 weeks 3 days.  No yolk sac or embryo observed.  Normal ovaries bilaterally.  Negative adnexal regions.  Cervix long and closed.  Discussed miscarriage.  Discussed treatment options of observation vs cytotec vs D&C.  At this time desires observation.    Plan repeat u/s in 2-3 wks for MAB.    Blood type A positive--care everywhere 06/11/2019  Plan to discontinue progesterone and continue PNV.    1. Miscarriage (CMD)  US  Transvaginal Only

## 2024-04-14 ENCOUNTER — Encounter: Payer: Self-pay | Admitting: Family Medicine

## 2024-04-14 ENCOUNTER — Ambulatory Visit (INDEPENDENT_AMBULATORY_CARE_PROVIDER_SITE_OTHER)

## 2024-04-14 ENCOUNTER — Ambulatory Visit: Admitting: Family Medicine

## 2024-04-14 ENCOUNTER — Other Ambulatory Visit: Payer: Self-pay

## 2024-04-14 DIAGNOSIS — Z3A08 8 weeks gestation of pregnancy: Secondary | ICD-10-CM

## 2024-04-14 DIAGNOSIS — O034 Incomplete spontaneous abortion without complication: Secondary | ICD-10-CM

## 2024-04-14 DIAGNOSIS — O021 Missed abortion: Secondary | ICD-10-CM

## 2024-04-14 DIAGNOSIS — Z3A01 Less than 8 weeks gestation of pregnancy: Secondary | ICD-10-CM

## 2024-04-14 DIAGNOSIS — O0990 Supervision of high risk pregnancy, unspecified, unspecified trimester: Secondary | ICD-10-CM

## 2024-04-14 DIAGNOSIS — O3680X0 Pregnancy with inconclusive fetal viability, not applicable or unspecified: Secondary | ICD-10-CM | POA: Diagnosis not present

## 2024-04-14 NOTE — Progress Notes (Signed)
 Orthopaedic Surgery Hand and Upper Extremity History and Physical Examination  CC: Left trigger thumb  HPI 04/14/2024: History of Present Illness The patient, a 39 year old female, presents today for evaluation of left stenosing tenosynovitis (trigger thumb). She has previously seen Dr. Sissy who performed bilateral carpal tunnel releases, as well as Dr. Garrel Hock and Dr. Murrell for this condition.  The patient reports recurrent episodes of left trigger thumb over the past 5 to 6 years. Initially, she managed the condition independently using wrapping, taping, and massaging the affected area until symptoms resolved. Following her experience with injections for carpal tunnel syndrome, she began seeking medical intervention for her trigger thumb. She has received 2 to 3 corticosteroid injections for her left trigger thumb in the past. She scheduled this appointment approximately one month ago due to severe pain in her thumb, which persists upon waking each morning. She describes her thumb as becoming fixed in flexion overnight, necessitating manual manipulation to restore mobility. Despite these efforts, she continues to experience significant pain throughout the day. Additionally, she has experienced similar symptoms in her ring finger on one occasion.  The patient is currently taking prenatal vitamins and is expected to undergo an ultrasound tomorrow to determine if she is currently pregnant.   MEDICATIONS Current: Prenatal vitamins.   Problem List:  Problem List[1]  Past Medical History: Medical History[2]   Medications: Current Rx ordered in [facility][3]  Allergies: Allergies as of 04/14/2024 - Reviewed 04/14/2024  Allergen Reaction Noted   Augmentin [amoxicillin-pot clavulanate] Itching 08/05/2012   Adhesive Itching 05/22/2022   Trazodone Other (See Comments) 05/23/2021   Methylphenidate Angioedema 09/21/2012    Past Surgical History: Surgical History[4]   Social  History: Social History   Occupational History   Not on file  Tobacco Use   Smoking status: Former    Current packs/day: 0.00    Average packs/day: 0.3 packs/day    Types: Cigarettes    Quit date: 04/10/2002    Years since quitting: 22.0    Passive exposure: Never   Smokeless tobacco: Never  Vaping Use   Vaping status: Never Used  Substance and Sexual Activity   Alcohol use: No   Drug use: No    Comment: Drug use: Denies   Sexual activity: Yes    Partners: Male    Birth control/protection: None     Family History: Family History[5] Otherwise, no relevant orthopaedic family history  ROS: Review of Systems: All systems reviewed and are negative except that mentioned in HPI  Work/Sport/Hobbies: See HPI  Physical Examination: Vitals:   04/14/24 0852  BP: 120/63  Pulse: 74  Temp: 97.9 F (36.6 C)   Constitutional: Awake, alert.  WN/WD Appearance: healthy, no acute distress, well-groomed Affect: Normal HEENT: EOMI, mucous membranes moist CV: RRR Pulm: breathing comfortably  Left upper Extremity / Hand Physical Exam Skin of the left upper extremity is intact with no obvious swelling or deformity. Tenderness to palpation over the A1 pulley of the left thumb. Minimally tender over the thumb CMC joint. Positive grind test. Pain with stress opposition of the thumb metacarpal but no pain with stress retropulsion of the thumb metacarpal. Palpable nodule over the A1 pulley.  Results: Results Imaging Prior x-rays of left hand show some joint space widening at the thumb [facility] joint.    Assessment/Plan:  Assessment & Plan 1. Left trigger thumb - progressive, chronic - History of recurrent left trigger thumb, symptoms for over five to six years - Physical examination: tenderness over A1 pulley,  palpable nodule, positive grind test - Previous treatments: two to three injections (not recommended further due to increased risk of tendon rupture) - Discussed surgical  intervention involving release of A1 pulley   - Risks: infection, painful scar, nerve damage, hematoma, seroma, potential recurrence   - Not all symptoms may be relieved due to early arthritis at the base of the thumb - Alternative non-surgical options: taping or splinting the joint at night - Patient would like to undergo surgery under local anesthetic only.  Because she may be pregnant and would potentially put the fetus at risk with postoperative pain medications, she will decide on surgical option after upcoming OB appointments regarding pregnancy status  2.  Early left thumb CMC arthritis - This is based on physical exam and widening of the thumb CMC joint on imaging. - Since this is not her primary issue we will observe at this time.  We did discuss that it may be playing a role in her current pain and therefore I cannot be certain whether she would have complete pain relief with left thumb trigger finger release.  Risks, benefits, goals, and limitations of continued non-operative treatment as well as operative treatment options were discussed in detail with the patient.  The post-operative course, and the importance of personal involvement in the rehabilitation and therapy process if needed were reinforced as well, and the patient expressed understanding.  We discussed the option of doing nothing as well as alternative treatments.  All questions were answered to their satisfaction.  The patient has elected to proceed with surgery at this time.  Patient is acceptable for ambulatory surgery center.     Marsa HERO. Chiaramonti, MD Hand and Upper Extremity Surgery [facility] Department of Orthopaedic Surgery [facility] 04/14/2024 9:15 AM      [1] Patient Active Problem List Diagnosis   Infertility, anovulation   PCOS (polycystic ovarian syndrome)   Elevated testosterone level in female   Female hirsutism   Migraine aura,  persistent, intractable, with status migrainosus   Secondary oligomenorrhea   Mass of lower inner quadrant of right breast   Fat necrosis of right breast   Abscess of right breast   Severe episode of recurrent major depressive disorder, without psychotic features    (CMD)   Dependent edema   GERD (gastroesophageal reflux disease)   History of preterm labor   Mass of upper outer quadrant of right breast   S/P laparoscopic sleeve gastrectomy   Acute mastitis of right breast   Mass of lower outer quadrant of right breast   Open wound of right breast   Calculus of gallbladder with chronic cholecystitis without obstruction   Trigger finger of left thumb   Obesity (BMI 30.0-34.9)   SI (sacroiliac) pain   Chronic right-sided low back pain without sciatica   Current pregnancy with history of pre-term labor in first trimester (CMD)   Weeks of gestation of pregnancy not specified (CMD)  [2] Past Medical History: Diagnosis Date   Abnormal ECG    Allergic    Asthma (CMD)    as a child   BMI 50.0-59.9, adult (CMD) 05/23/2021   Dependent edema 08/17/2021   Elevated testosterone level in female 10/19/2016   Fat necrosis of right breast 05/23/2021   Female hirsutism 10/09/2016   Female infertility    GERD (gastroesophageal reflux disease)    Herpes    Hypertension    Mass of lower inner quadrant of right breast  05/23/2021   Mass of upper outer quadrant of right breast 01/03/2022   Migraine aura, persistent, intractable, with status migrainosus 08/11/2014   Obesity, morbid, BMI 50 or higher (CMD) 08/11/2014   PCOS (polycystic ovarian syndrome)    Polycystic ovary syndrome    PONV (postoperative nausea and vomiting)    Preterm labor (CMD) 06/11/2019   Secondary oligomenorrhea 10/09/2016   Severe episode of recurrent major depressive disorder, without psychotic features    (CMD) 07/13/2021   Severe obesity (BMI >= 40) (CMD) 08/11/2014   Slow to wake up after  anesthesia    post cholecystectomy  [3] Meds Ordered in [facility]  Medication Sig Dispense Refill   cyanocobalamin (VITAMIN B12) 500 mcg tablet Take 500 mcg by mouth daily.     PNV no.95/ferrous fum/folic ac (PRENATAL ORAL) Take by mouth.     albuterol HFA (PROVENTIL HFA;VENTOLIN HFA;PROAIR HFA) 90 mcg/actuation inhaler Inhale 2 puffs every 4 (four) hours as needed for wheezing or shortness of breath. (Patient not taking: Reported on 03/24/2024) 1 each 0   busPIRone (BUSPAR) 5 mg tablet Take 1 tablet (5 mg total) by mouth 3 (three) times a day Indications: repeated episodes of anxiety. (Patient not taking: Reported on 03/13/2024) 90 tablet 2   CALCIUM CITRATE ORAL Take by mouth. (Patient not taking: Reported on 04/01/2024)     cholecalciferol (VITAMIN D3) 1,000 unit (25 mcg) tablet Take 2,000 Units by mouth. (Patient not taking: Reported on 04/01/2024)     fexofenadine-pseudoePHEDrine (Allegra-D 12 Hour) 60-120 mg 12 hr tablet Take 1 tablet by mouth 2 (two) times a day. (Patient not taking: Reported on 03/13/2024) 20 tablet 0   fluticasone propionate (FLONASE) 50 mcg/spray nasal spray Administer 2 sprays into each nostril 2 (two) times a day. (Patient not taking: Reported on 04/01/2024) 16 g 0   folic acid (FOLVITE) 400 mcg tablet Take 400 mcg by mouth. (Patient not taking: Reported on 04/01/2024)     letrozole (FEMARA) 2.5 mg tablet Take as discussed in office.  Start with 1 tablet and then increase to 5 tablets on day 5. (Patient not taking: Reported on 03/13/2024) 15 tablet 1   lidocaine HCL 4 % gel Apply to affected area no more than 3-4 times a day. (Patient not taking: Reported on 04/01/2024) 75 mL 0   omeprazole (PriLOSEC) 20 mg DR capsule Take 1 capsule (20 mg total) by mouth daily. (Patient not taking: Reported on 04/01/2024) 30 capsule 3   progesterone (PROMETRIUM) 200 mg cap capsule Insert 1 capsule (200 mg total) into the vagina daily. (Patient not taking: Reported on 04/14/2024)  30 capsule 1   valACYclovir (VALTREX) 500 mg tablet Take 1 tablet (500 mg total) by mouth 2 (two) times a day as needed (hsv) Indications: herpes simplex infection. (Patient not taking: Reported on 04/01/2024) 30 tablet 2   No current Epic-ordered facility-administered medications on file.  [4] Past Surgical History: Procedure Laterality Date   BARIATRIC SURGERY     BREAST BIOPSY     BREAST LUMPECTOMY Bilateral 02/01/2022   Procedure: BREAST LUMPECTOMY   CARPAL TUNNEL RELEASE Left 05/05/2022   Procedure: CARPAL TUNNEL RELEASE   CARPAL TUNNEL RELEASE  07/26/2022   CHOLECYSTECTOMY     Procedure: CHOLECYSTECTOMY; 2022   GYNECOLOGIC CRYOSURGERY     OTHER SURGICAL HISTORY Right 02/01/2022   Procedure: OTHER SURGICAL HISTORY (Excisional biopsy Right breast x2); Dr. Bert Raford Salvage   OVUM / OOCYTE RETRIEVAL N/A 09/21/2012   Procedure: OVA ASPIRATION UNDER ULTRASOUND GUIDANCE;  Surgeon:  Lauraine Jama Gaines, MD;  Location: [facility];  Service: Gynecology;  Laterality: N/A;  OVA 10am/ per Roxanne   SLEEVE GASTROPLASTY N/A 05/29/2022   Procedure: SLEEVE GASTRECTOMY LAPAROSCOPIC;  Surgeon: Teresa Prentice Blush, MD;  Location: [facility];  Service: General;  Laterality: N/A;  Scope # [id number]  [5] Family History Problem Relation Name Age of Onset   Heart disease Mother     Sjogren's syndrome Mother     Rheum arthritis Mother     Anemia Mother     Heart disease Father Da    Heart disease Maternal Grandfather Pa    Cancer Paternal Grandfather Mar    Alcohol abuse Paternal Grandfather Mar    Diabetes Brother Octaviano    Heart disease Maternal Grandmother G    Cancer Maternal Aunt D

## 2024-04-14 NOTE — Patient Instructions (Signed)
 It was a pleasure taking care of you today.  I have sent a message to our surgery scheduler to see if she can get you scheduled for a procedure on Friday.  This will come to your MyChart if possible.  Regarding doctors who are at our practice to do fertility treatments I know that Dr. Zina, Dr. Cleatus, Dr. Erik, and Dr. Herchel all use the medication you are requesting.  Maybe we can get your follow-up appointment with one of them.  Let me know if you have any questions.

## 2024-04-15 ENCOUNTER — Other Ambulatory Visit (HOSPITAL_COMMUNITY): Payer: Self-pay | Admitting: Family Medicine

## 2024-04-15 ENCOUNTER — Telehealth: Payer: Self-pay | Admitting: Family Medicine

## 2024-04-15 DIAGNOSIS — O021 Missed abortion: Secondary | ICD-10-CM

## 2024-04-15 NOTE — Telephone Encounter (Signed)
 Patient called saying she would like to cancel her procedure for this Friday. She said she called the hospital and they transferred her to our phone number.

## 2024-04-16 DIAGNOSIS — O034 Incomplete spontaneous abortion without complication: Secondary | ICD-10-CM | POA: Insufficient documentation

## 2024-04-16 NOTE — Assessment & Plan Note (Signed)
 Patient with IUP with heartbeat on 12/3.  Currently gestational sac with no fetal heart rate appreciated.  No bleeding.  Discussed medication management versus expectant management versus surgical intervention.  Patient reports her husband travels for work and so would be better if she could schedule procedure and elected for D&E.  Referral placed for scheduling.  Hopeful for this Friday.  Discussed MAU precautions.

## 2024-04-16 NOTE — Progress Notes (Signed)
" ° ° °  Subjective:  Jessica Pierce is a 38 y.o. female who presents to the clinic today for viability ultrasound  HPI:  Patient previously evaluated for viability.  Was on letrozole to get pregnant.  Had ultrasound on 12/3 showing IUP with fetal heart rate of 114.  Ultrasound performed on 12/23 showing gestational sac with no fetal heart rate.  Gestational sac measuring approximately 8 weeks.  Patient reports no bleeding or issues since then.  Objective:  Physical Exam: LMP 01/26/2024 (Exact Date)   Gen: Alert, well-appearing CV: Regular rate Pulm: Normal work of breathing, speaking full sentences GI: Soft, nontender MSK: no edema, cyanosis, or clubbing noted Skin: warm, dry Neuro: grossly normal, moves all extremities Psych: Normal affect and thought content  No results found for this or any previous visit (from the past 72 hours).   Assessment/Plan:  Retained products of conception after miscarriage Patient with IUP with heartbeat on 12/3.  Currently gestational sac with no fetal heart rate appreciated.  No bleeding.  Discussed medication management versus expectant management versus surgical intervention.  Patient reports her husband travels for work and so would be better if she could schedule procedure and elected for D&E.  Referral placed for scheduling.  Hopeful for this Friday.  Discussed MAU precautions.   Lab Orders  No laboratory test(s) ordered today    No orders of the defined types were placed in this encounter.     Steffan Rover, MD Attending Family Medicine Physician, [facility] for [facility], [facility]   04/16/2024 10:16 AM  "

## 2024-04-18 ENCOUNTER — Ambulatory Visit (HOSPITAL_COMMUNITY): Admission: RE | Admit: 2024-04-18 | Source: Home / Self Care | Admitting: Family Medicine

## 2024-04-18 ENCOUNTER — Ambulatory Visit (HOSPITAL_COMMUNITY)

## 2024-04-18 ENCOUNTER — Encounter (HOSPITAL_COMMUNITY): Admission: RE | Payer: Self-pay | Source: Home / Self Care

## 2024-04-18 DIAGNOSIS — O021 Missed abortion: Secondary | ICD-10-CM

## 2024-04-18 SURGERY — DILATION AND EVACUATION, UTERUS
Anesthesia: Choice

## 2024-04-24 ENCOUNTER — Encounter: Admitting: Family Medicine
